# Patient Record
Sex: Male | Born: 1965
Health system: Southern US, Community
[De-identification: ages and names within clinical notes are randomized; demographics above are authoritative.]

## PROBLEM LIST (undated history)

## (undated) DIAGNOSIS — E785 Hyperlipidemia, unspecified: Secondary | ICD-10-CM

## (undated) DIAGNOSIS — Z8249 Family history of ischemic heart disease and other diseases of the circulatory system: Secondary | ICD-10-CM

## (undated) DIAGNOSIS — I10 Essential (primary) hypertension: Secondary | ICD-10-CM

## (undated) DIAGNOSIS — T7840XA Allergy, unspecified, initial encounter: Secondary | ICD-10-CM

## (undated) HISTORY — DX: Family history of ischemic heart disease and other diseases of the circulatory system: Z82.49

## (undated) HISTORY — DX: Hyperlipidemia, unspecified: E78.5

## (undated) HISTORY — DX: Essential (primary) hypertension: I10

## (undated) HISTORY — DX: Allergy, unspecified, initial encounter: T78.40XA

---

## 1991-10-15 ENCOUNTER — Encounter: Payer: Self-pay | Admitting: Cardiology

## 2002-12-07 ENCOUNTER — Ambulatory Visit (HOSPITAL_BASED_OUTPATIENT_CLINIC_OR_DEPARTMENT_OTHER): Admission: RE | Admit: 2002-12-07 | Discharge: 2002-12-07 | Payer: Self-pay | Admitting: Orthopedic Surgery

## 2006-04-28 ENCOUNTER — Ambulatory Visit: Payer: Self-pay | Admitting: Family Medicine

## 2006-05-29 ENCOUNTER — Ambulatory Visit: Payer: Self-pay | Admitting: Family Medicine

## 2007-02-02 ENCOUNTER — Ambulatory Visit: Payer: Self-pay | Admitting: Family Medicine

## 2007-02-18 ENCOUNTER — Ambulatory Visit: Payer: Self-pay | Admitting: Family Medicine

## 2007-02-27 ENCOUNTER — Ambulatory Visit: Payer: Self-pay | Admitting: Family Medicine

## 2007-04-03 ENCOUNTER — Ambulatory Visit: Payer: Self-pay | Admitting: Family Medicine

## 2007-04-14 ENCOUNTER — Ambulatory Visit: Payer: Self-pay | Admitting: Family Medicine

## 2007-10-19 ENCOUNTER — Emergency Department (HOSPITAL_COMMUNITY): Admission: EM | Admit: 2007-10-19 | Discharge: 2007-10-19 | Payer: Self-pay | Admitting: Emergency Medicine

## 2007-11-05 ENCOUNTER — Ambulatory Visit: Payer: Self-pay | Admitting: Family Medicine

## 2008-01-26 ENCOUNTER — Ambulatory Visit: Payer: Self-pay | Admitting: Family Medicine

## 2008-03-14 ENCOUNTER — Ambulatory Visit: Payer: Self-pay | Admitting: Family Medicine

## 2008-05-31 ENCOUNTER — Ambulatory Visit: Payer: Self-pay | Admitting: Family Medicine

## 2008-10-12 ENCOUNTER — Ambulatory Visit: Payer: Self-pay | Admitting: Family Medicine

## 2008-10-14 HISTORY — PX: LAPAROSCOPIC CHOLECYSTECTOMY: SUR755

## 2008-10-27 ENCOUNTER — Encounter: Admission: RE | Admit: 2008-10-27 | Discharge: 2008-10-27 | Payer: Self-pay | Admitting: Family Medicine

## 2008-10-27 ENCOUNTER — Ambulatory Visit: Payer: Self-pay | Admitting: Family Medicine

## 2008-11-22 ENCOUNTER — Ambulatory Visit: Payer: Self-pay | Admitting: Family Medicine

## 2008-11-28 ENCOUNTER — Encounter: Admission: RE | Admit: 2008-11-28 | Discharge: 2008-11-28 | Payer: Self-pay | Admitting: Family Medicine

## 2009-02-07 ENCOUNTER — Ambulatory Visit: Payer: Self-pay | Admitting: Family Medicine

## 2009-02-22 ENCOUNTER — Encounter: Admission: RE | Admit: 2009-02-22 | Discharge: 2009-02-22 | Payer: Self-pay | Admitting: Family Medicine

## 2009-02-22 ENCOUNTER — Ambulatory Visit: Payer: Self-pay | Admitting: Family Medicine

## 2009-03-09 ENCOUNTER — Ambulatory Visit: Payer: Self-pay | Admitting: Family Medicine

## 2009-04-18 ENCOUNTER — Ambulatory Visit: Payer: Self-pay | Admitting: Family Medicine

## 2009-04-21 ENCOUNTER — Encounter: Admission: RE | Admit: 2009-04-21 | Discharge: 2009-04-21 | Payer: Self-pay | Admitting: Family Medicine

## 2009-05-29 ENCOUNTER — Emergency Department (HOSPITAL_COMMUNITY): Admission: EM | Admit: 2009-05-29 | Discharge: 2009-05-29 | Payer: Self-pay | Admitting: Emergency Medicine

## 2009-05-30 ENCOUNTER — Ambulatory Visit: Payer: Self-pay | Admitting: Family Medicine

## 2009-06-14 ENCOUNTER — Ambulatory Visit (HOSPITAL_COMMUNITY): Admission: RE | Admit: 2009-06-14 | Discharge: 2009-06-14 | Payer: Self-pay | Admitting: Family Medicine

## 2009-07-21 ENCOUNTER — Encounter: Admission: RE | Admit: 2009-07-21 | Discharge: 2009-07-21 | Payer: Self-pay | Admitting: General Surgery

## 2009-10-30 ENCOUNTER — Encounter (INDEPENDENT_AMBULATORY_CARE_PROVIDER_SITE_OTHER): Payer: Self-pay | Admitting: *Deleted

## 2009-10-30 ENCOUNTER — Ambulatory Visit: Payer: Self-pay | Admitting: Family Medicine

## 2009-11-06 ENCOUNTER — Encounter: Admission: RE | Admit: 2009-11-06 | Discharge: 2009-11-06 | Payer: Self-pay | Admitting: Family Medicine

## 2009-11-07 ENCOUNTER — Encounter: Payer: Self-pay | Admitting: Cardiology

## 2009-11-07 ENCOUNTER — Ambulatory Visit: Payer: Self-pay | Admitting: Family Medicine

## 2009-11-14 ENCOUNTER — Ambulatory Visit: Payer: Self-pay | Admitting: Cardiology

## 2009-11-14 DIAGNOSIS — I10 Essential (primary) hypertension: Secondary | ICD-10-CM | POA: Insufficient documentation

## 2009-11-14 DIAGNOSIS — R072 Precordial pain: Secondary | ICD-10-CM | POA: Insufficient documentation

## 2009-11-14 DIAGNOSIS — E785 Hyperlipidemia, unspecified: Secondary | ICD-10-CM | POA: Insufficient documentation

## 2009-12-22 DIAGNOSIS — R079 Chest pain, unspecified: Secondary | ICD-10-CM | POA: Insufficient documentation

## 2009-12-25 ENCOUNTER — Ambulatory Visit: Payer: Self-pay | Admitting: Cardiology

## 2010-01-04 ENCOUNTER — Ambulatory Visit: Payer: Self-pay | Admitting: Family Medicine

## 2010-07-02 ENCOUNTER — Ambulatory Visit: Payer: Self-pay | Admitting: Family Medicine

## 2010-11-05 ENCOUNTER — Encounter: Payer: Self-pay | Admitting: Family Medicine

## 2010-11-13 NOTE — Consult Note (Signed)
Summary: [facility]  [facility]   Imported By: Marylou Mccoy 12/04/2009 12:56:20  _____________________________________________________________________  External Attachment:    Type:   Image     Comment:   External Document

## 2010-11-13 NOTE — Assessment & Plan Note (Signed)
Summary: np6/ chestpain. also eval for stress test. pt has atena. gd   Visit Type:  Initial Consult Primary Provider:  Dr. Standley Dakins  CC:  chest pain.  History of Present Illness: The patient presents for evaluation of chest discomfort. This has been going on for 2-3 weeks. It seems to come and go. When it does start it may be constant for 2-3 days. It is an upper chest pressure like "air pockets". He has taken Zantac to try to help this. However, it seems to go away on its own. It is moderate in intensity. There is no associated nausea vomiting or diaphoresis. There is no radiation elsewhere. He does not get short of breath with this. He is on like reflux. He did start taking Aleve and actually has had improvement in his symptoms with no episodes in the past several days. He is able to be a row but reactive exercising without bringing on this discomfort. He denies any palpitations, presyncope or syncope. He has had no PND or orthopnea. Of note he has had no prior cardiac history or workup.  Current Medications (verified): 1)  Amlodipine Besylate 5 Mg Tabs (Amlodipine Besylate) .Marland Kitchen.. 1 By Mouth Daily 2)  Zantac 150 Mg Tabs (Ranitidine Hcl) .Marland Kitchen.. 1 By Mouth Two Times A Day 3)  Aleve 220 Mg Tabs (Naproxen Sodium) .... 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   Vital Signs:  Patient profile:   45 year old male Height:      70 inches Weight:      207 pounds BMI:     29.81 Pulse rate:   97 / minute Resp:     16 per minute BP sitting:   128 / 92  (right arm)  Vitals Entered By: Marrion Coy, CNA  (November 14, 2009 9:07 AM)  Physical Exam  General:  Well developed, well nourished, in no acute distress. Head:  normocephalic and atraumatic Eyes:  PERRLA/EOM intact; conjunctiva and lids normal. Mouth:  Teeth, gums and palate normal. Oral mucosa normal. Neck:  Neck supple, no JVD. No masses, thyromegaly or abnormal cervical nodes. Chest Wall:  no deformities or breast masses noted Lungs:  Clear bilaterally to auscultation and percussion. Abdomen:  Bowel sounds positive; abdomen soft and non-tender without masses, organomegaly, or hernias noted. No hepatosplenomegaly. Msk:  Back normal, normal gait. Muscle strength and tone normal. Extremities:  No clubbing or cyanosis. Neurologic:  Alert and oriented x 3. Skin:  Intact without lesions or rashes. Cervical Nodes:  no significant adenopathy Axillary Nodes:  no significant adenopathy Inguinal Nodes:  no significant adenopathy Psych:  Normal affect.   Detailed Cardiovascular Exam  Neck    Carotids: Carotids full and equal bilaterally without bruits.      Neck Veins: Normal, no JVD.    Heart    Inspection: no deformities or lifts noted.      Palpation: normal PMI with no thrills palpable.      Auscultation: regular rate and rhythm, S1, S2 without murmurs, rubs, gallops, or clicks.    Vascular    Abdominal Aorta: no  palpable masses, pulsations, or audible bruits.      Femoral Pulses: normal femoral pulses bilaterally.      Pedal Pulses: normal pedal pulses bilaterally.      Radial Pulses: normal radial pulses bilaterally.      Peripheral Circulation: no clubbing, cyanosis, or edema noted with normal capillary refill.     EKG  Procedure date:  11/14/2009  Findings:      sinus rhythm, rate 97, axis within normal limits, intervals within normal limits, no acute ST-T wave changes.  Impression & Recommendations:  Problem # 1:  PRECORDIAL PAIN (ICD-786.51) The chest pain is atypical. However, he has a significant family  history of coronary disease. Given this screening with an exercise treadmill test is indicated. This will allow me to exclude obstructive coronary disease with a degree of certainty, risk stratify and most importantly give him a prescription for exercise. He is exercising 3 days per week and I would suggest 5 Orders: EKG w/ Interpretation (93000) Treadmill (Treadmill)  Problem # 2:  DYSLIPIDEMIA (ICD-272.4) I will defer to his primary M.D. He said he had high lipids in the past but didn't tolerate Lipitor.  Problem # 3:  ESSENTIAL HYPERTENSION, BENIGN (ICD-401.1) His blood pressure is controlled. He will continue the meds as listed.  Patient Instructions: 1)  Your physician recommends that you schedule a follow-up appointment at time of treadmill 2)  Your physician recommends that you continue on your current medications as directed. Please refer to the Current Medication list given to you today. 3)  Your physician has requested that you have an exercise tolerance test.  For further information please visit [email or web address]  Please also follow instruction sheet, as given.

## 2011-01-07 ENCOUNTER — Encounter (INDEPENDENT_AMBULATORY_CARE_PROVIDER_SITE_OTHER): Payer: Managed Care, Other (non HMO) | Admitting: Family Medicine

## 2011-01-07 DIAGNOSIS — Z8249 Family history of ischemic heart disease and other diseases of the circulatory system: Secondary | ICD-10-CM

## 2011-01-07 DIAGNOSIS — Z0000 Encounter for general adult medical examination without abnormal findings: Secondary | ICD-10-CM

## 2011-01-07 DIAGNOSIS — E785 Hyperlipidemia, unspecified: Secondary | ICD-10-CM

## 2011-01-07 DIAGNOSIS — I10 Essential (primary) hypertension: Secondary | ICD-10-CM

## 2011-01-19 LAB — DIFFERENTIAL
Basophils Absolute: 0 10*3/uL (ref 0.0–0.1)
Eosinophils Absolute: 0 10*3/uL (ref 0.0–0.7)
Eosinophils Relative: 1 % (ref 0–5)
Lymphocytes Relative: 52 % — ABNORMAL HIGH (ref 12–46)
Monocytes Absolute: 0.3 10*3/uL (ref 0.1–1.0)

## 2011-01-19 LAB — CBC
HCT: 41.7 % (ref 39.0–52.0)
Hemoglobin: 14.6 g/dL (ref 13.0–17.0)
MCV: 91.1 fL (ref 78.0–100.0)
RDW: 13.3 % (ref 11.5–15.5)

## 2011-01-19 LAB — POCT CARDIAC MARKERS
CKMB, poc: <1 ng/mL — ABNORMAL LOW (ref 1.0–8.0)
Troponin i, poc: <0.05 ng/mL (ref 0.00–0.09)
Troponin i, poc: <0.05 ng/mL (ref 0.00–0.09)

## 2011-01-19 LAB — POCT I-STAT, CHEM 8
BUN: 11 mg/dL (ref 6–23)
Calcium, Ion: 1.13 mmol/L (ref 1.12–1.32)
Chloride: 101 mEq/L (ref 96–112)
Glucose, Bld: 91 mg/dL (ref 70–99)
TCO2: 27 mmol/L (ref 0–100)

## 2011-01-31 ENCOUNTER — Ambulatory Visit (INDEPENDENT_AMBULATORY_CARE_PROVIDER_SITE_OTHER): Payer: Managed Care, Other (non HMO) | Admitting: Family Medicine

## 2011-01-31 DIAGNOSIS — J069 Acute upper respiratory infection, unspecified: Secondary | ICD-10-CM

## 2011-03-01 NOTE — Op Note (Signed)
NAME:  Samuel Richards, Samuel Richards                       ACCOUNT NO.:  [id number]   MEDICAL RECORD NO.:  [id number]                   PATIENT TYPE:  AMB   LOCATION:  [facility]                                  FACILITY:  [facility]   PHYSICIAN:  Katy Fitch. Naaman Plummer., M.D.          DATE OF BIRTH:  10-23-1965   DATE OF PROCEDURE:  12/07/2002  DATE OF DISCHARGE:                                 OPERATIVE REPORT   PREOPERATIVE DIAGNOSIS:  Foreign body, right palm overlying index flexor  sheath, A1 pulley, status post penetrating injury 10 days prior, without  signs of spontaneous pointing out.   POSTOPERATIVE DIAGNOSIS:  Foreign body, right palm overlying index flexor  sheath, A1 pulley, status post penetrating injury 10 days prior, without  signs of spontaneous pointing out.   PROCEDURE:  Incision and removal of wood foreign body, right palm and flexor  sheath region.   SURGEON:  Katy Fitch. Sypher, M.D.   ASSISTANT:  Jonni Sanger, P.A.   ANESTHESIA:  Marcaine 0.25% and 2% lidocaine field block without sedation.  This was performed in the minor operating room.   INDICATIONS:  The patient is a 45 year old gentleman who sustained an on-the-  job penetrating injury to his right palm.  He was seen at an outpatient  facility, where he was noted to have a probable splinter.  The physicians at  the outpatient facility tried to remove the splinter without success.  He  was placed on antibiotics and observed for more than one week.  His splinter  did not point out spontaneously; therefore, he was referred for a hand  surgery consult.   Clinical examination at this time revealed evidence of a firm wood body  directly over the flexor sheath of his right index finger at the level of  the metacarpal head.  He was unable to fully flex his finger, raising the  question of whether or not the foreign body had penetrated the flexor  sheath.   After informed consent he is brought to the operating room at this  time.   DESCRIPTION OF PROCEDURE:  The patient was brought to the operating room and  placed in the supine position on the operating table.  Following Betadine  prep, 0.25% Marcaine and 2% lidocaine were infiltrated at the metacarpal  head level to obtain a field block and digital block of the right index  finger.   When anesthesia was satisfactory, the arm was prepped with Betadine soap and  solution and sterilely draped.  The arm was exsanguinated with direct  compression and an arterial tourniquet on the proximal brachium inflated to  250 mmHg.  The procedure commenced with a short incision directly over the  mass.  The subcutaneous tissues were carefully divided, taking care to  identify the radial proper digital nerve to the index finger.  This was  gently retracted.  The palpable mass was easily identified.  This was  circumferentially dissected  with tenotomy scissors and the base of the  splinter identified.  This was in a vertical position extending to the  flexor sheath.  This was easily removed.  The wound was then irrigated and  inspected.  The A1 pulley was inspected and found to be intact.  There was  no sign of distention of the sheath due to purulent material.  After the  fragment was removed, full range of motion of the finger was recovered.  There were no signs of deep wound sepsis, but there was some inflammation  due to the presence of foreign body.   The wound was irrigated and dressed open with Xeroflo, sterile gauze, and an  Ace wrap.   The patient is placed on Darvocet-N 100 one or two tablets p.o. q.4-6h.  p.r.n. pain.  He will return to our office for follow-up in approximately  five days for dressing change.                                               Katy Fitch Naaman Plummer., M.D.    RVS/MEDQ  D:  12/07/2002  T:  12/07/2002  Job:  [id number]

## 2011-06-02 IMAGING — NM NM HEPATO W/GB/PHARM/[PERSON_NAME]
3 series · 13 of 13 positions shown · non-contrast
Comparison: None

CLINICAL DATA: Abdominal discomfort and bloating

NUCLEAR MEDICINE HEPATOBILIARY IMAGING WITH GALLBLADDER EF
TECHNIQUE: Sequential images of the abdomen were obtained [DATE]
minutes following intravenous administration of
radiopharmaceutical.  After slow intravenous infusion of 1.02 uCg
Cholecystokinin, gallbladder ejection fraction was determined.
Radiopharmaceutical:  5 mCi Vc-YYm Choletec

[he hepatobiliary · 3.43mm/px · 6 of 30 frames shown (1 of 3)]
[frame 3/30]
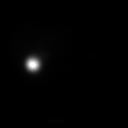
[frame 8/30]
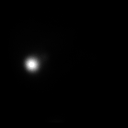
[frame 13/30]
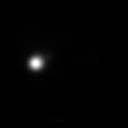
[frame 18/30]
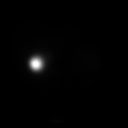
[frame 23/30]
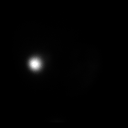
[frame 28/30]
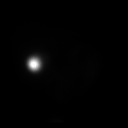

[he hepatobiliary · 1 of 1 slices shown (2 of 3)]
[im 1/1]
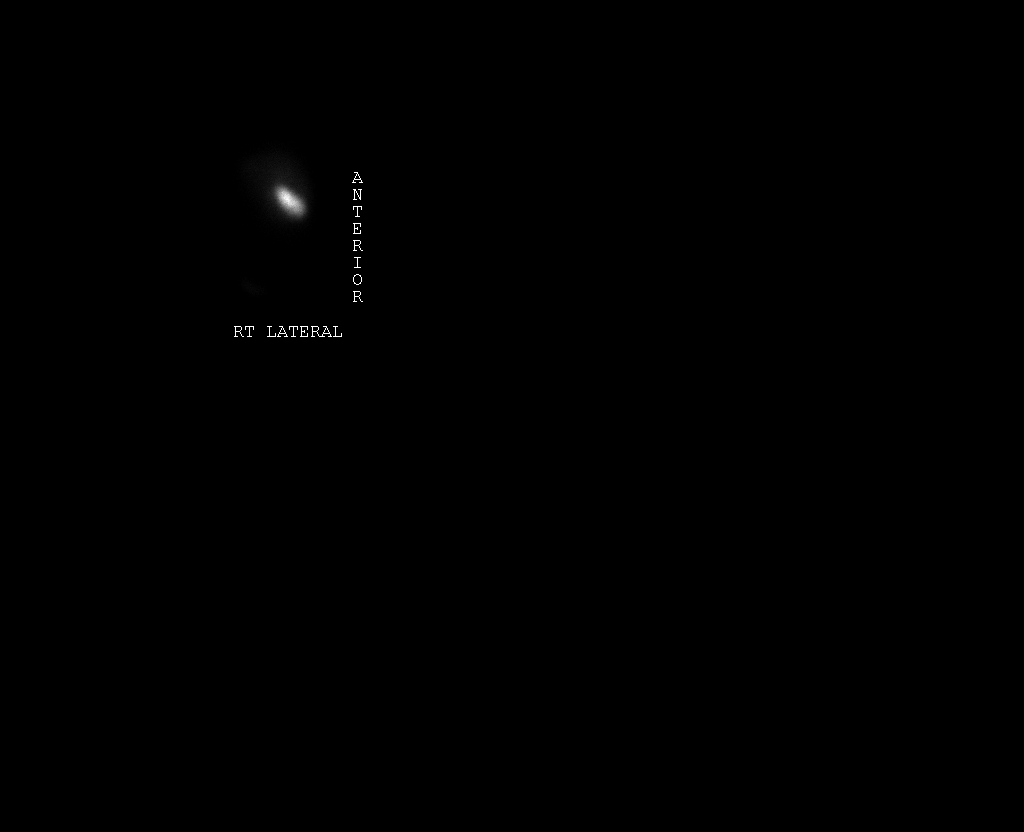

[he hepatobiliary · 3.43mm/px · 6 of 49 frames shown (3 of 3)]
[frame 5/49]
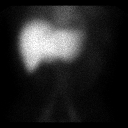
[frame 13/49]
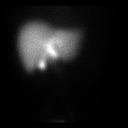
[frame 21/49]
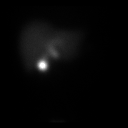
[frame 29/49]
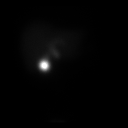
[frame 37/49]
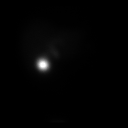
[frame 45/49]
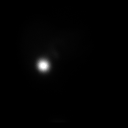

[13 of 13 positions shown; findings below may reference images not displayed]

FINDINGS: The the patient was injected with 5 mCi of technetium 99m
Choletec intravenously and imaging of the upper abdomen was
performed.  The radionuclide appears throughout the liver.  There
is excretion into the intrahepatic ductal system with visualization
of the gallbladder and common bile duct.  This represents a normal
nuclear medicine hepatobiliary scan.

The patient did not  experience symptoms during CCK infusion.
mcg of CCK were infused and gallbladder ejection fraction was
assessed.  At 30 minutes the gallbladder ejection fraction is
measured at only 5.2%.  Normal values are greater than or equal to
30% injected at 30 minutes.
IMPRESSION: 1.  Normal nuclear medicine hepatobiliary scan.
2.  Abnormal gallbladder ejection fraction of only 5.2% at 30
minutes.

## 2011-07-17 ENCOUNTER — Other Ambulatory Visit: Payer: Self-pay | Admitting: Family Medicine

## 2011-09-18 ENCOUNTER — Other Ambulatory Visit: Payer: Self-pay | Admitting: Family Medicine

## 2011-09-23 ENCOUNTER — Encounter: Payer: Self-pay | Admitting: Internal Medicine

## 2011-09-23 ENCOUNTER — Ambulatory Visit (INDEPENDENT_AMBULATORY_CARE_PROVIDER_SITE_OTHER): Payer: Managed Care, Other (non HMO) | Admitting: Medical

## 2011-09-23 ENCOUNTER — Encounter: Payer: Self-pay | Admitting: Medical

## 2011-09-23 DIAGNOSIS — R110 Nausea: Secondary | ICD-10-CM

## 2011-09-23 DIAGNOSIS — R1031 Right lower quadrant pain: Secondary | ICD-10-CM

## 2011-09-23 DIAGNOSIS — R10819 Abdominal tenderness, unspecified site: Secondary | ICD-10-CM

## 2011-09-23 DIAGNOSIS — R109 Unspecified abdominal pain: Secondary | ICD-10-CM | POA: Insufficient documentation

## 2011-09-23 DIAGNOSIS — Z125 Encounter for screening for malignant neoplasm of prostate: Secondary | ICD-10-CM

## 2011-09-23 LAB — POCT URINALYSIS DIPSTICK
Bilirubin, UA: NEGATIVE
Blood, UA: NEGATIVE
Glucose, UA: NEGATIVE
Ketones, UA: NEGATIVE
Spec Grav, UA: 1.01

## 2011-09-23 NOTE — Progress Notes (Signed)
Subjective:   HPI  Samuel Richards is a 44 y.o. male who presents with several month hx/o intermittent RLQ abdominal pain.  It has been giving 4/10 pain the last few days.   He notes lower abdominal pain, worse RLQ, radiates to right low back, pain is dull and aching.  Worse last 3-4 days.  He denies trauma, injury, no hx/o renal stone, on recent urinary changes, no hx/o prostate problems, no family hx/o cancer.  Denies recent fever, weight loss, constipation, no blood in stool or urine, no hx/o hip or other arthritis, no vomiting or diarrhea, but has had some nausea.  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 No hx/o hernia.  Using Aleve with some relief.  No other aggravating or relieving factors.    No other c/o.  The following portions of the patient's history were reviewed and updated as appropriate: allergies, current medications, past family history, past medical history, past social history, past surgical history and problem list.  Past Medical History  Diagnosis Date  . Allergy   . Hypertension   . Dyslipidemia   . FH: CAD (coronary artery disease)    Review of Systems Constitutional: -fever, -chills, -sweats, -unexpected -weight change,-fatigue ENT: -runny nose, -ear pain, -sore throat Cardiology:  -chest pain, -palpitations, -edema Respiratory: -cough, -shortness of breath, -wheezing Gastroenterology: +abdominal pain, -nausea, -vomiting, -diarrhea, -constipation Hematology: -bleeding or bruising problems Musculoskeletal: -arthralgias, -myalgias, -joint swelling, +back pain Ophthalmology: -vision changes Urology: -dysuria, -difficulty urinating, -hematuria, -urinary frequency, -urgency Neurology: -headache, -weakness, -tingling, -numbness     Objective:   Physical Exam  Filed Vitals:   09/23/11 1401  BP: 128/88  Pulse: 72  Temp: 98.2 F (36.8 C)  Resp: 12    General appearance: alert, no distress, WD/WN, black male Oral cavity: MMM, no  lesions Neck: supple, no lymphadenopathy, no thyromegaly, no masses Heart: RRR, normal S1, S2, no murmurs Lungs: CTA bilaterally, no wheezes, rhonchi, or rales Abdomen: +bs, soft, mild to moderate right lower abdomen, non distended, no masses, no hepatomegaly, no splenomegaly Pulses: 2+ symmetric, upper and lower extremities, normal cap refill Rectal: anus normal, nontender, can't palpate entire prostate, but no obvious nodules, nontender, firm   Assessment and Plan :    Encounter Diagnoses  Name Primary?  . Abdominal pain Yes  . RLQ abdominal pain   . Inguinal tenderness   . Nausea   . Screening for prostate cancer    Advised that etiology is broad, could be renal stone, possibly appendicitis or infection, but can't rule out other.  Urinalysis today unremarkable. Will check labs, will set up for CT abdomen/pelvis.  Call if worse in meantime, otherwise follow-up pending studies.

## 2011-09-24 ENCOUNTER — Telehealth: Payer: Self-pay | Admitting: Family Medicine

## 2011-09-24 LAB — CBC WITH DIFFERENTIAL/PLATELET
Basophils Relative: 0 % (ref 0–1)
Hemoglobin: 13.7 g/dL (ref 13.0–17.0)
MCHC: 33.8 g/dL (ref 30.0–36.0)
Monocytes Relative: 6 % (ref 3–12)
Neutro Abs: 1.5 10*3/uL — ABNORMAL LOW (ref 1.7–7.7)
Neutrophils Relative %: 36 % — ABNORMAL LOW (ref 43–77)
RBC: 4.45 MIL/uL (ref 4.22–5.81)

## 2011-09-24 LAB — COMPREHENSIVE METABOLIC PANEL
AST: 16 U/L (ref 0–37)
Albumin: 4.5 g/dL (ref 3.5–5.2)
BUN: 19 mg/dL (ref 6–23)
CO2: 28 mEq/L (ref 19–32)
Calcium: 9.4 mg/dL (ref 8.4–10.5)
Chloride: 102 mEq/L (ref 96–112)
Glucose, Bld: 97 mg/dL (ref 70–99)
Potassium: 3.9 mEq/L (ref 3.5–5.3)

## 2011-09-24 LAB — PSA: PSA: 0.89 ng/mL (ref <=4.00)

## 2011-09-24 NOTE — Telephone Encounter (Signed)
PATIENTS CT ABD/PELVIS IS 09/25/11 @ 1030AM. CLS

## 2011-09-24 NOTE — Telephone Encounter (Signed)
Message copied by Janeice Robinson on Tue Sep 24, 2011  2:41 PM ------      Message from: Jac Canavan      Created: Mon Sep 23, 2011  7:28 PM       CT ab/pelvis

## 2011-09-25 ENCOUNTER — Ambulatory Visit
Admission: RE | Admit: 2011-09-25 | Discharge: 2011-09-25 | Disposition: A | Discharge status: Home / Self Care | Payer: Managed Care, Other (non HMO) | Source: Ambulatory Visit | Attending: Medical | Admitting: Medical

## 2011-09-25 LAB — URINE CULTURE: Colony Count: NO GROWTH

## 2011-09-25 MED ORDER — IOHEXOL 300 MG/ML  SOLN
125.0000 mL | Freq: Once | INTRAMUSCULAR | Status: AC | PRN
  Administered 2011-09-25: 125 mL via INTRAVENOUS

## 2011-09-25 NOTE — Telephone Encounter (Signed)
DONE

## 2011-10-16 ENCOUNTER — Other Ambulatory Visit: Payer: Self-pay | Admitting: Family Medicine

## 2011-12-09 ENCOUNTER — Other Ambulatory Visit: Payer: Self-pay | Admitting: Internal Medicine

## 2011-12-10 ENCOUNTER — Other Ambulatory Visit: Payer: Self-pay | Admitting: Medical

## 2011-12-10 MED ORDER — AMLODIPINE BESYLATE 5 MG PO TABS: 5.0000 mg | ORAL_TABLET | Freq: Every day | ORAL | Status: DC

## 2011-12-11 NOTE — Telephone Encounter (Signed)
done

## 2012-01-30 ENCOUNTER — Other Ambulatory Visit: Payer: Self-pay | Admitting: Family Medicine

## 2012-02-25 ENCOUNTER — Other Ambulatory Visit: Payer: Self-pay | Admitting: Family Medicine

## 2012-04-05 ENCOUNTER — Other Ambulatory Visit: Payer: Self-pay | Admitting: Family Medicine

## 2012-05-04 ENCOUNTER — Telehealth: Payer: Self-pay | Admitting: Family Medicine

## 2012-05-04 ENCOUNTER — Other Ambulatory Visit: Payer: Self-pay | Admitting: Family Medicine

## 2012-05-04 MED ORDER — ROSUVASTATIN CALCIUM 10 MG PO TABS: 10.0000 mg | ORAL_TABLET | Freq: Every day | ORAL | Status: DC

## 2012-05-04 NOTE — Telephone Encounter (Signed)
Crestor renewed. Patient has a CPE scheduled

## 2012-05-12 ENCOUNTER — Ambulatory Visit (INDEPENDENT_AMBULATORY_CARE_PROVIDER_SITE_OTHER): Payer: Managed Care, Other (non HMO) | Admitting: Family Medicine

## 2012-05-12 ENCOUNTER — Encounter: Payer: Self-pay | Admitting: Family Medicine

## 2012-05-12 VITALS — BP 124/84 | HR 92 | Ht 70.0 in | Wt 214.0 lb

## 2012-05-12 DIAGNOSIS — I10 Essential (primary) hypertension: Secondary | ICD-10-CM

## 2012-05-12 DIAGNOSIS — Z0000 Encounter for general adult medical examination without abnormal findings: Secondary | ICD-10-CM

## 2012-05-12 DIAGNOSIS — E785 Hyperlipidemia, unspecified: Secondary | ICD-10-CM

## 2012-05-12 LAB — LIPID PANEL
LDL Cholesterol: 59 mg/dL (ref 0–99)
Total CHOL/HDL Ratio: 3.2 Ratio
VLDL: 32 mg/dL (ref 0–40)

## 2012-05-12 NOTE — Progress Notes (Signed)
  Subjective:    Patient ID: Samuel Richards, male    DOB: 04-28-1966, 46 y.o.   MRN: [id number]  HPI He is here for complete examination. His record was reviewed. He had a colonoscopy done earlier this year. His tetanus is up to date. Last blood work was in December. He has had difficulty recently with some postnasal drainage but no sneezing, itchy watery eyes, congestion. He does not smoke. He exercises regularly. His work and home life are going quite well. Family and social history were reviewed.   Review of Systems  Constitutional: Negative.   HENT: Negative.   Eyes: Negative.   Respiratory: Negative.   Cardiovascular: Negative.   Gastrointestinal: Negative.   Genitourinary: Negative.   Musculoskeletal: Negative.   Skin: Negative.   Neurological: Negative.   Hematological: Negative.   Psychiatric/Behavioral: Negative.        Objective:   Physical Exam BP 124/84  Pulse 92  Ht 5\' 10"  (1.778 m)  Wt 214 lb (97.07 kg)  BMI 30.71 kg/m2  SpO2 99%  General Appearance:    Alert, cooperative, no distress, appears stated age  Head:    Normocephalic, without obvious abnormality, atraumatic  Eyes:    PERRL, conjunctiva/corneas clear, EOM's intact, fundi    benign  Ears:    Normal TM's and external ear canals  Nose:   Nares normal, mucosa normal, no drainage or sinus   tenderness  Throat:   Lips, mucosa, and tongue normal; teeth and gums normal  Neck:   Supple, no lymphadenopathy;  thyroid:  no   enlargement/tenderness/nodules; no carotid   bruit or JVD  Back:    Spine nontender, no curvature, ROM normal, no CVA     tenderness  Lungs:     Clear to auscultation bilaterally without wheezes, rales or     ronchi; respirations unlabored  Chest Wall:    No tenderness or deformity   Heart:    Regular rate and rhythm, S1 and S2 normal, no murmur, rub   or gallop  Breast Exam:    No chest wall tenderness, masses or gynecomastia  Abdomen:     Soft, non-tender, nondistended, normoactive  bowel sounds,    no masses, no hepatosplenomegaly  Genitalia:   deferred   Rectal:   deferred   Extremities:   No clubbing, cyanosis or edema  Pulses:   2+ and symmetric all extremities  Skin:   Skin color, texture, turgor normal, no rashes or lesions  Lymph nodes:   Cervical, supraclavicular, and axillary nodes normal  Neurologic:   CNII-XII intact, normal strength, sensation and gait; reflexes 2+ and symmetric throughout          Psych:   Normal mood, affect, hygiene and grooming.           Assessment & Plan:   1. Routine general medical examination at a health care facility    2. DYSLIPIDEMIA  Lipid panel  3. Essential hypertension, benign     continue present medications. Encouraged him to continue with his regular exercise. No particular therapy for the PND at this time the

## 2012-05-12 NOTE — Patient Instructions (Signed)
Keep taking good care of yourself 

## 2012-06-12 ENCOUNTER — Other Ambulatory Visit: Payer: Self-pay | Admitting: Family Medicine

## 2012-10-20 ENCOUNTER — Ambulatory Visit (INDEPENDENT_AMBULATORY_CARE_PROVIDER_SITE_OTHER): Payer: Managed Care, Other (non HMO) | Admitting: Medical

## 2012-10-20 ENCOUNTER — Encounter: Payer: Self-pay | Admitting: Medical

## 2012-10-20 VITALS — BP 132/80 | HR 72 | Temp 98.4°F | Resp 16 | Wt 211.0 lb

## 2012-10-20 DIAGNOSIS — R079 Chest pain, unspecified: Secondary | ICD-10-CM

## 2012-10-20 DIAGNOSIS — K2990 Gastroduodenitis, unspecified, without bleeding: Secondary | ICD-10-CM

## 2012-10-20 DIAGNOSIS — K2970 Gastritis, unspecified, without bleeding: Secondary | ICD-10-CM

## 2012-10-20 DIAGNOSIS — I10 Essential (primary) hypertension: Secondary | ICD-10-CM

## 2012-10-20 MED ORDER — OMEPRAZOLE 40 MG PO CPDR: DELAYED_RELEASE_CAPSULE | ORAL | Status: DC

## 2012-10-20 NOTE — Progress Notes (Signed)
Subjective: Here for c/o pain shooting from chest to back.  Started this week, has occurred about 4 times.  Each time brief, lasts for a few seconds, feels a sharp pain going from front of upper chest to back.  Can get similar pain if turns torso a certain way.  Chest feels kind of sore.  Denies any recent heavy lifting or strenuous activity. He does note some recent belching and acid indigestion.  Used some zantac a few times that didn't help.  When the sharp pains occurred, did get a flush feeling, dry mouth, something just didn't feel right.  Not sure if this is his heart or something else.  He does note BP being elevated a few times 150/90s.  No other aggravating or relieving factors.  No other c/o.   Past Medical History  Diagnosis Date  . Hypertension   . Dyslipidemia   . FH: CAD (coronary artery disease)   . Allergy   'ROS as in HPI    Objective:   Physical Exam  Filed Vitals:   10/20/12 1458  BP: 132/80  Pulse: 72  Temp: 98.4 F (36.9 C)  Resp: 16    General appearance: alert, no distress, WD/WN Oral cavity: MMM, no lesions Neck: supple, no lymphadenopathy, no thyromegaly, no masses Chest wall nontender Back: mild tenderness of paraspinal muscles upper back Heart: RRR, normal S1, S2, no murmurs Lungs: CTA bilaterally, no wheezes, rhonchi, or rales Abdomen: +bs, soft, mild epigastric tenderness, otherwise nontender, non distended, no masses, no hepatomegaly, no splenomegaly Pulses: 2+ symmetric, upper and lower extremities, normal cap refill   Adult ECG Report  Indication: chest pain  Rate: 76bpm  Rhythm: normal sinus rhythm and sinus arrhythmia  QRS Axis: 67 degrees  PR Interval:  QRS Duration:  QTc:  Conduction Disturbances: none  Other Abnormalities: questionable PQ depression II and questionable ST elevation V1, but this was seen on prior EKGs from 2011, no acute change today  Patient's cardiac risk factors are: hypertension and male gender.  EKG comparison: 1/11 EKG, no acute changes  Narrative Interpretation: sinus arrhythmia, no other acute changes   Assessment and Plan :    Encounter Diagnoses  Name Primary?  . Chest pain Yes  . Gastritis   . Essential hypertension, benign    Reviewed EKG.  Advised that symptoms suggest more of a gastritis etiology, doesn't sound cardiac.  He also has some musculoskeletal tenderness.  Advised short term 2-3 wk trial of Omeprazole, avoid GERD triggers, hydrate well.  Bring BP log next time.  C/t norvasc 5mg  daily.   We did discuss signs of MI that would prompt him to call 911.  Call/return prn, otherwise recheck in 7mo.

## 2012-10-20 NOTE — Patient Instructions (Addendum)
   Begin Omeprazole once daily in the morning 45 minutes before breakfast.  Avoid foods that worsen reflux and indigestion such as acid foods, greasy foods, fried foods, gassy foods like beans and onions, and spicy foods.    Drink plenty of water.   Recheck 2 weeks, recheck sooner if worse   SYMPTOMS OF A HEART ATTACK The most common signs and symptoms include:   Tightness or squeezing in the chest.   Feeling of heaviness on the chest.   Discomfort in the arms, neck, or jaw.   Shortness of breath and nausea.   Cold, wet skin.   Chest pain or discomfort brought on by physical effort or excitement which increase the oxygen needs of the heart.   SEEK IMMEDIATE MEDICAL CARE IF:   You develop nausea, vomiting, or shortness of breath.   You feel faint, lightheaded, or pass out.   Your chest discomfort gets worse.   You are sweating or experience sudden profound fatigue.   Your discomfort lasts longer than 15 minutes.   If you have a history of heart disease or angina, you should tell your caregiver right away about any increase in the severity or frequency of your chest discomfort or angina attacks. When you have angina, you should stop what you are doing and sit down. This may bring relief in 3 to 5 minutes. If your caregiver has prescribed nitro, take it as directed.   WHAT IS A HEART ATTACK: Myocardial Infarction A myocardial infarction (MI) is damage to the heart that is not reversible. It is also called a heart attack. An MI usually occurs when a heart (coronary) artery becomes blocked or narrowed. This cuts off the blood supply to the heart. When one or more of the heart (coronary) arteries becomes blocked, that area of the heart begins to die. This causes pain felt during an MI.  If you think you might be having an MI, call your local emergency services immediately (911 in [place] It is recommended that you take a 162 mg non-enteric coated aspirin if you do not have an aspirin  allergy. Do not drive yourself to the hospital or wait to see if your symptoms go away. The sooner MI is treated, the greater the amount of heart muscle saved. Time is muscle. It can save your life. CAUSES  An MI can occur from:  A gradual buildup of a fatty substance called plaque. When plaque builds up in the arteries, this condition is called atherosclerosis. This buildup can block or reduce the blood supply to the heart artery(s).   A sudden plaque rupture within a heart artery that causes a blood clot (thrombus). A blood clot can block the heart artery which does not allow blood flow to the heart.   A severe tightening (spasm) of the heart artery. This is a less common cause of a heart attack. When a heart artery spasms, it cuts off blood flow through the artery. Spasms can occur in heart arteries that do not have atherosclerosis.  RISK FACTORS People at risk for an MI usually have one or more risk factors, such as:  High blood pressure.   High cholesterol.   Smoking.   Gender. Men have a higher heart attack risk.   Overweight/obesity.   Age.   Family history.   Lack of exercise.   Diabetes.   Stress.   Excessive alcohol use.   Street drug use (cocaine and methamphetamines).

## 2012-10-22 ENCOUNTER — Telehealth: Payer: Self-pay | Admitting: Medical

## 2012-10-26 NOTE — Addendum Note (Signed)
Addended by: Jac Canavan on: 10/26/2012 07:46 PM   Modules accepted: Orders

## 2012-10-28 NOTE — Telephone Encounter (Signed)
LM

## 2012-11-02 ENCOUNTER — Other Ambulatory Visit: Payer: Self-pay

## 2012-11-02 MED ORDER — ROSUVASTATIN CALCIUM 10 MG PO TABS: 10.0000 mg | ORAL_TABLET | Freq: Every day | ORAL | Status: DC

## 2012-11-02 NOTE — Telephone Encounter (Signed)
Sent in before but ins. Requires 90 days on crestor sent revised script in

## 2012-11-13 ENCOUNTER — Encounter: Payer: Self-pay | Admitting: Medical

## 2012-11-13 ENCOUNTER — Ambulatory Visit (INDEPENDENT_AMBULATORY_CARE_PROVIDER_SITE_OTHER): Payer: Managed Care, Other (non HMO) | Admitting: Medical

## 2012-11-13 VITALS — BP 122/86 | HR 76 | Resp 18 | Wt 208.0 lb

## 2012-11-13 DIAGNOSIS — R079 Chest pain, unspecified: Secondary | ICD-10-CM

## 2012-11-13 DIAGNOSIS — I10 Essential (primary) hypertension: Secondary | ICD-10-CM

## 2012-11-13 DIAGNOSIS — M549 Dorsalgia, unspecified: Secondary | ICD-10-CM

## 2012-11-13 DIAGNOSIS — E785 Hyperlipidemia, unspecified: Secondary | ICD-10-CM

## 2012-11-13 DIAGNOSIS — Z125 Encounter for screening for malignant neoplasm of prostate: Secondary | ICD-10-CM

## 2012-11-13 NOTE — Progress Notes (Signed)
Subjective: Here for multiple issues.  I saw him recently for chest and back pain.   After using Omeprazole and cutting out spicy foods, the pains completely resolved.  He has had no additional similar pains.    He does report getting a catch in his upper back from time to time if he turns a certain way.   He notes going to chiropractor 2 years for similar back pain.  Was told he may have bulging discs.  He wonders if he should go back to chiropractor.  Pain is not severe, not daily.   No arm pains, neck pains, no arm numbness, tingling, weakness.  He has a biometric form that he needs completed for work.   He is compliant with his BP medication and cholesterol medication daily.    Past Medical History  Diagnosis Date  . Hypertension   . Dyslipidemia   . FH: CAD (coronary artery disease)   . Allergy    Review of Systems Constitutional: -fever, -chills, -sweats, -unexpected -weight change,-fatigue ENT: -runny nose, -ear pain, -sore throat Cardiology:  -chest pain, -palpitations, -edema Respiratory: -cough, -shortness of breath, -wheezing Gastroenterology: -abdominal pain, -nausea, -vomiting, -diarrhea, -constipation  Musculoskeletal: -arthralgias, -myalgias, -joint swelling, +back pain Ophthalmology: -vision changes Urology: -dysuria, -difficulty urinating, -hematuria, -urinary frequency, -urgency Neurology: -headache, -weakness, -tingling, -numbness   Objective: Filed Vitals:   11/13/12 0940  BP: 122/86  Pulse: 76  Resp: 18    General appearance: alert, no distress, WD/WN Neck: supple, no lymphadenopathy, no thyromegaly, no masses Heart: RRR, normal S1, S2, no murmurs Lungs: CTA bilaterally, no wheezes, rhonchi, or rales Abdomen: +bs, soft, non tender, non distended, no masses, no hepatomegaly, no splenomegaly Back: nontender, normal ROM, no obvious deformity MSK: UE nontender, normal ROM, no deformity Pulses: 2+ symmetric, upper and lower extremities, normal cap  refill  Assessment: Encounter Diagnoses  Name Primary?  . Back pain Yes  . Hyperlipidemia   . Essential hypertension, benign   . Chest pain      Plan: Back pain - discussed daily stretching and exercise routine.  Can use OTC NSAID prn.  He will work on pre -work stretching routine.  If not seeing improvement in a few weeks, he can call back and I'll send referral for chiropractic or PT for upper back pain.  Hyperlipidemia - compliant with current medication.   HTN - compliant with current medication, controlled.  Chest pain - resolved.   Follow-up for labs soon, fasting

## 2012-11-16 ENCOUNTER — Telehealth: Payer: Self-pay | Admitting: Family Medicine

## 2012-11-16 NOTE — Telephone Encounter (Signed)
Patient was notified to come by the office when he gets a chance to have lab work done. CLS

## 2012-11-16 NOTE — Telephone Encounter (Signed)
Message copied by Janeice Robinson on Mon Nov 16, 2012  2:53 PM ------      Message from: Jac Canavan      Created: Fri Nov 13, 2012 10:23 PM       i reviewed back over his chart.   He is actually due for labs.  Last labs July.  At his convenience, have him come in for fasting labs.  I have put orders in the computer.  (nurse visit)

## 2012-11-30 ENCOUNTER — Ambulatory Visit (INDEPENDENT_AMBULATORY_CARE_PROVIDER_SITE_OTHER): Payer: Managed Care, Other (non HMO) | Admitting: Medical

## 2012-11-30 ENCOUNTER — Encounter: Payer: Self-pay | Admitting: Medical

## 2012-11-30 VITALS — BP 120/82 | HR 72 | Temp 98.5°F | Resp 16 | Wt 210.0 lb

## 2012-11-30 DIAGNOSIS — J019 Acute sinusitis, unspecified: Secondary | ICD-10-CM

## 2012-11-30 MED ORDER — AMOXICILLIN 875 MG PO TABS: 875.0000 mg | ORAL_TABLET | Freq: Two times a day (BID) | ORAL | Status: DC

## 2012-11-30 NOTE — Progress Notes (Signed)
Subjective:  Samuel Richards is a 47 y.o. male who presents for 1 wk hx/o sinus pressure, nasal green mucous drainage, slight fever, ear fullness, and mild fatigue.  He notes similar symptoms with prior sinus infection.  Using [facility] cold and sinus for symptoms.  Denies sick contacts.  No other aggravating or relieving factors.  No other c/o.  Objective: Filed Vitals:   11/30/12 1548  BP: 120/82  Pulse: 72  Temp: 98.5 F (36.9 C)  Resp: 16    General appearance: Alert, WD/WN, no distress                             Skin: warm, no rash                           Head: +maxillary sinus tenderness,                            Eyes: conjunctiva normal, corneas clear, PERRLA                            Ears: pearly TMs, external ear canals normal                          Nose: septum midline, turbinates swollen, with erythema and clear discharge             Mouth/throat: MMM, tongue normal, mild pharyngeal erythema                           Neck: supple, no adenopathy, no thyromegaly, nontender                          Heart: RRR, normal S1, S2, no murmurs                         Lungs: CTA bilaterally, no wheezes, rales, or rhonchi      Assessment and Plan:   Encounter Diagnosis  Name Primary?  . Acute sinusitis Yes    Prescription given for Amoxicillin.  Discussed symptomatic relief, nasal saline, and call or return if worse or not improving

## 2012-12-14 ENCOUNTER — Other Ambulatory Visit: Payer: Self-pay

## 2012-12-14 MED ORDER — AMLODIPINE BESYLATE 5 MG PO TABS: 5.0000 mg | ORAL_TABLET | Freq: Every day | ORAL | Status: DC

## 2013-03-20 ENCOUNTER — Other Ambulatory Visit: Payer: Self-pay | Admitting: Family Medicine

## 2013-04-06 ENCOUNTER — Encounter: Payer: Self-pay | Admitting: Medical

## 2013-04-06 ENCOUNTER — Ambulatory Visit (INDEPENDENT_AMBULATORY_CARE_PROVIDER_SITE_OTHER): Payer: Managed Care, Other (non HMO) | Admitting: Medical

## 2013-04-06 VITALS — BP 100/80 | HR 68 | Temp 97.8°F | Resp 16 | Wt 213.0 lb

## 2013-04-06 DIAGNOSIS — R053 Chronic cough: Secondary | ICD-10-CM

## 2013-04-06 DIAGNOSIS — R05 Cough: Secondary | ICD-10-CM

## 2013-04-06 DIAGNOSIS — R059 Cough, unspecified: Secondary | ICD-10-CM

## 2013-04-06 MED ORDER — BENZONATATE 200 MG PO CAPS: 200.0000 mg | ORAL_CAPSULE | Freq: Two times a day (BID) | ORAL | Status: DC | PRN

## 2013-04-06 NOTE — Patient Instructions (Signed)
Begin Zyrtec at bedtime, 10mg  daily.  This is an antihistamine.   Use salt water gargles and warm fluids such as tea and coffee to clear out the throat.   Make sure you are drinking plenty of water. Avoid spicy and acidic foods that aggravate the reflux.  Consider short term adding back Prilosec daily.    Use tessalon Perles cough drops prescribed today as needed up to 3 times daily  Do this regimen for 2-4 weeks.  If not improving, return to consider chest xray and other evaluation.

## 2013-04-06 NOTE — Progress Notes (Signed)
Subjective: Here for nagging cough for a few weeks.  Irritating.  Not necessarily having cough every day, but can come on out of the blue.   He is having some runny nose, some sneezing.  No itchy or watery eyes.  No nausea, no vomiting.   Cough is making his back hurt.  Hx/o GERD medication, but no recent worse symptoms, hasn't been using his Prilosec.  No fever, no night sweats, no weight loss.  No sick contacts.  Doesn't feel sick.  Using OTC coricidin, cough drops.  Not helping.  He is a nonsmoker.    Past Medical History  Diagnosis Date  . Hypertension   . Dyslipidemia   . FH: CAD (coronary artery disease)   . Allergy    ROS as in subjective  Objective: Filed Vitals:   04/06/13 1141  BP: 100/80  Pulse: 68  Temp: 97.8 F (36.6 C)  Resp: 16    General appearance: alert, no distress, WD/WN HEENT: normocephalic, sclerae anicteric, TMs pearly, nares patent, no discharge or erythema, pharynx normal Oral cavity: MMM, no lesions Neck: supple, no lymphadenopathy, no thyromegaly, no masses Heart: RRR, normal S1, S2, no murmurs Lungs: CTA bilaterally, no wheezes, rhonchi, or rales Abdomen: +bs, soft, non tender, non distended, no masses, no hepatomegaly, no splenomegaly Pulses: 2+ symmetric, upper and lower extremities, normal cap refill Ext: no edema   Assessment: Encounter Diagnosis  Name Primary?  . Chronic cough Yes    Plan: Discussed possible causes of chronic cough.  Likely post nasal drip.  Begin zyrtec QHS x 3-4 wk, begin back Prilosec for now x 3-4 wk, can use ibuprofen for chest wall inflammation from coughing so much, begin Tessalon Perles prn.  Follow-up prn or 3-4 wk if not improving.

## 2013-05-07 ENCOUNTER — Other Ambulatory Visit: Payer: Self-pay | Admitting: Family Medicine

## 2013-05-13 ENCOUNTER — Encounter: Payer: Self-pay | Admitting: Family Medicine

## 2013-05-17 ENCOUNTER — Ambulatory Visit (INDEPENDENT_AMBULATORY_CARE_PROVIDER_SITE_OTHER): Payer: Managed Care, Other (non HMO) | Admitting: Family Medicine

## 2013-05-17 ENCOUNTER — Encounter: Payer: Self-pay | Admitting: Family Medicine

## 2013-05-17 VITALS — BP 130/82 | HR 90 | Ht 70.0 in | Wt 210.0 lb

## 2013-05-17 DIAGNOSIS — Z0000 Encounter for general adult medical examination without abnormal findings: Secondary | ICD-10-CM

## 2013-05-17 DIAGNOSIS — E785 Hyperlipidemia, unspecified: Secondary | ICD-10-CM

## 2013-05-17 DIAGNOSIS — K219 Gastro-esophageal reflux disease without esophagitis: Secondary | ICD-10-CM

## 2013-05-17 DIAGNOSIS — I10 Essential (primary) hypertension: Secondary | ICD-10-CM

## 2013-05-17 LAB — CBC WITH DIFFERENTIAL/PLATELET
Hemoglobin: 14 g/dL (ref 13.0–17.0)
Lymphs Abs: 1.9 10*3/uL (ref 0.7–4.0)
MCH: 30.9 pg (ref 26.0–34.0)
Monocytes Relative: 7 % (ref 3–12)
Neutro Abs: 1.7 10*3/uL (ref 1.7–7.7)
Neutrophils Relative %: 43 % (ref 43–77)
RBC: 4.53 MIL/uL (ref 4.22–5.81)

## 2013-05-17 LAB — HEMOCCULT GUIAC POC 1CARD (OFFICE)

## 2013-05-17 MED ORDER — AMLODIPINE BESYLATE 5 MG PO TABS: ORAL_TABLET | ORAL | Status: DC

## 2013-05-17 MED ORDER — OMEPRAZOLE 40 MG PO CPDR: DELAYED_RELEASE_CAPSULE | ORAL | Status: DC

## 2013-05-17 MED ORDER — ROSUVASTATIN CALCIUM 10 MG PO TABS: ORAL_TABLET | ORAL | Status: DC

## 2013-05-17 NOTE — Progress Notes (Signed)
  Subjective:    Patient ID: Samuel Richards, male    DOB: [birth year], 47 y.o.   MRN: [id number]  HPI He is here for complete examination. He has enjoyed excellent health. He continues on medications listed in the chart. He rarely uses Prilosec. He is exercising regularly. He does not smoke and occasionally drinks. His work and home life are going quite well. Family history is unchanged. He does have a father that had an MI at age 11. He has no other concerns or questions.   Review of Systems Negative except as above    Objective:   Physical Exam BP 130/82  Pulse 90  Ht 5\' 10"  (1.778 m)  Wt 210 lb (95.255 kg)  BMI 30.13 kg/m2  General Appearance:    Alert, cooperative, no distress, appears stated age  Head:    Normocephalic, without obvious abnormality, atraumatic  Eyes:    PERRL, conjunctiva/corneas clear, EOM's intact, fundi    benign  Ears:    Normal TM's and external ear canals  Nose:   Nares normal, mucosa normal, no drainage or sinus   tenderness  Throat:   Lips, mucosa, and tongue normal; teeth and gums normal  Neck:   Supple, no lymphadenopathy;  thyroid:  no   enlargement/tenderness/nodules; no carotid   bruit or JVD  Back:    Spine nontender, no curvature, ROM normal, no CVA     tenderness  Lungs:     Clear to auscultation bilaterally without wheezes, rales or     ronchi; respirations unlabored  Chest Wall:    No tenderness or deformity   Heart:    Regular rate and rhythm, S1 and S2 normal, no murmur, rub   or gallop  Breast Exam:    No chest wall tenderness, masses or gynecomastia  Abdomen:     Soft, non-tender, nondistended, normoactive bowel sounds,    no masses, no hepatosplenomegaly  Genitalia:    Normal male external genitalia without lesions.  Testicles without masses.  No inguinal hernias.  Rectal:    Normal sphincter tone, no masses or tenderness; guaiac negative stool.  Prostate smooth, no nodules, not enlarged.  Extremities:   No clubbing, cyanosis or edema   Pulses:   2+ and symmetric all extremities  Skin:   Skin color, texture, turgor normal, no rashes or lesions  Lymph nodes:   Cervical, supraclavicular, and axillary nodes normal  Neurologic:   CNII-XII intact, normal strength, sensation and gait; reflexes 2+ and symmetric throughout          Psych:   Normal mood, affect, hygiene and grooming.          Assessment & Plan:  DYSLIPIDEMIA - Plan: rosuvastatin (CRESTOR) 10 MG tablet, Comprehensive metabolic panel, Lipid panel  Essential hypertension, benign - Plan: amLODipine (NORVASC) 5 MG tablet, CBC with Differential, Comprehensive metabolic panel, Lipid panel  GERD (gastroesophageal reflux disease) - Plan: omeprazole (PRILOSEC) 40 MG capsule  Routine general medical examination at a health care facility - Plan: CBC with Differential, Comprehensive metabolic panel, Lipid panel

## 2013-05-18 ENCOUNTER — Encounter: Payer: Self-pay | Admitting: Internal Medicine

## 2013-05-18 LAB — COMPREHENSIVE METABOLIC PANEL
Albumin: 4.6 g/dL (ref 3.5–5.2)
CO2: 29 mEq/L (ref 19–32)
Calcium: 9.7 mg/dL (ref 8.4–10.5)
Glucose, Bld: 90 mg/dL (ref 70–99)
Potassium: 4 mEq/L (ref 3.5–5.3)
Sodium: 138 mEq/L (ref 135–145)
Total Protein: 6.9 g/dL (ref 6.0–8.3)

## 2013-05-18 LAB — LIPID PANEL
Cholesterol: 132 mg/dL (ref 0–200)
Triglycerides: 157 mg/dL — ABNORMAL HIGH (ref <150)

## 2013-09-23 ENCOUNTER — Other Ambulatory Visit: Payer: Self-pay | Admitting: Family Medicine

## 2014-05-19 ENCOUNTER — Other Ambulatory Visit: Payer: Self-pay | Admitting: Family Medicine

## 2014-05-19 ENCOUNTER — Encounter: Payer: Self-pay | Admitting: Family Medicine

## 2014-05-19 ENCOUNTER — Ambulatory Visit (INDEPENDENT_AMBULATORY_CARE_PROVIDER_SITE_OTHER): Payer: Managed Care, Other (non HMO) | Admitting: Family Medicine

## 2014-05-19 VITALS — Ht 70.0 in | Wt 213.0 lb

## 2014-05-19 DIAGNOSIS — M25569 Pain in unspecified knee: Secondary | ICD-10-CM

## 2014-05-19 DIAGNOSIS — M25562 Pain in left knee: Secondary | ICD-10-CM

## 2014-05-19 DIAGNOSIS — G8929 Other chronic pain: Secondary | ICD-10-CM

## 2014-05-19 DIAGNOSIS — Z79899 Other long term (current) drug therapy: Secondary | ICD-10-CM

## 2014-05-19 DIAGNOSIS — I10 Essential (primary) hypertension: Secondary | ICD-10-CM

## 2014-05-19 DIAGNOSIS — M542 Cervicalgia: Secondary | ICD-10-CM

## 2014-05-19 DIAGNOSIS — Z0000 Encounter for general adult medical examination without abnormal findings: Secondary | ICD-10-CM

## 2014-05-19 DIAGNOSIS — E785 Hyperlipidemia, unspecified: Secondary | ICD-10-CM

## 2014-05-19 LAB — CBC WITH DIFFERENTIAL/PLATELET
BASOS PCT: 0 % (ref 0–1)
Basophils Absolute: 0 10*3/uL (ref 0.0–0.1)
EOS ABS: 0 10*3/uL (ref 0.0–0.7)
Eosinophils Relative: 1 % (ref 0–5)
HEMATOCRIT: 42.7 % (ref 39.0–52.0)
HEMOGLOBIN: 14.7 g/dL (ref 13.0–17.0)
LYMPHS ABS: 1.7 10*3/uL (ref 0.7–4.0)
Lymphocytes Relative: 40 % (ref 12–46)
MCH: 30.6 pg (ref 26.0–34.0)
MCHC: 34.4 g/dL (ref 30.0–36.0)
MCV: 88.8 fL (ref 78.0–100.0)
MONO ABS: 0.3 10*3/uL (ref 0.1–1.0)
MONOS PCT: 6 % (ref 3–12)
NEUTROS PCT: 53 % (ref 43–77)
Neutro Abs: 2.2 10*3/uL (ref 1.7–7.7)
Platelets: 262 10*3/uL (ref 150–400)
RBC: 4.81 MIL/uL (ref 4.22–5.81)
RDW: 14.2 % (ref 11.5–15.5)
WBC: 4.2 10*3/uL (ref 4.0–10.5)

## 2014-05-19 LAB — HEMOCCULT GUIAC POC 1CARD (OFFICE)

## 2014-05-19 MED ORDER — ROSUVASTATIN CALCIUM 10 MG PO TABS: ORAL_TABLET | ORAL | Status: DC

## 2014-05-19 MED ORDER — AMLODIPINE BESYLATE 5 MG PO TABS: ORAL_TABLET | ORAL | Status: DC

## 2014-05-19 NOTE — Progress Notes (Signed)
Subjective:    Patient ID: Samuel Richards, male    DOB: 05/09/1966, 48 y.o.   MRN: [id number]  HPI  He is here for complete examination. He does check his blood pressure daily. He does have his own machine. It is compared ours and does compare favorably. He also is had intermittent difficulty with neck pain and has questions concerning going to chiropractor. He also notes intermittent left knee pain with occasional popping however this does not interfere with his physical functioning. He notes that his reflux symptoms are better now that he is using a pillow and raising his head. He continues on medications listed in the chart. Family and social history were reviewed. Life in general is going quite well for him.   Review of Systems  All other systems reviewed and are negative.      Objective:   Physical Exam Ht 5\' 10"  (1.778 m)  Wt 213 lb (96.616 kg)  BMI 30.56 kg/m2  General Appearance:    Alert, cooperative, no distress, appears stated age  Head:    Normocephalic, without obvious abnormality, atraumatic  Eyes:    PERRL, conjunctiva/corneas clear, EOM's intact, fundi    benign  Ears:    Normal TM's and external ear canals  Nose:   Nares normal, mucosa normal, no drainage or sinus   tenderness  Throat:   Lips, mucosa, and tongue normal; teeth and gums normal  Neck:   Supple, no lymphadenopathy;  thyroid:  no   enlargement/tenderness/nodules; no carotid   bruit or JVD  Back:    Spine nontender, no curvature, ROM normal, no CVA     tenderness  Lungs:     Clear to auscultation bilaterally without wheezes, rales or     ronchi; respirations unlabored  Chest Wall:    No tenderness or deformity   Heart:    Regular rate and rhythm, S1 and S2 normal, no murmur, rub   or gallop  Breast Exam:    No chest wall tenderness, masses or gynecomastia  Abdomen:     Soft, non-tender, nondistended, normoactive bowel sounds,    no masses, no hepatosplenomegaly  Genitalia:    Normal male external  genitalia without lesions.  Testicles without masses.  No inguinal hernias.  Rectal:    Normal sphincter tone, no masses or tenderness; guaiac negative stool.  Prostate smooth, no nodules, not enlarged.  Extremities:   No clubbing, cyanosis or edema. Left knee exam shows no effusion or palpable tenderness. Ligaments intact. Negative McMurray's testing.   Pulses:   2+ and symmetric all extremities  Skin:   Skin color, texture, turgor normal, no rashes or lesions  Lymph nodes:   Cervical, supraclavicular, and axillary nodes normal  Neurologic:   CNII-XII intact, normal strength, sensation and gait; reflexes 2+ and symmetric throughout          Psych:   Normal mood, affect, hygiene and grooming.          Assessment & Plan:  DYSLIPIDEMIA - Plan: rosuvastatin (CRESTOR) 10 MG tablet, Lipid panel  Essential hypertension, benign - Plan: amLODipine (NORVASC) 5 MG tablet, CBC with Differential, Comprehensive metabolic panel  Routine general medical examination at a health care facility - Plan: CBC with Differential, Comprehensive metabolic panel, Lipid panel, POCT occult blood stool  Encounter for long-term (current) use of other medications - Plan: CBC with Differential, Comprehensive metabolic panel, Lipid panel  Left knee pain  Neck pain of over 3 months duration  encouraged him to remain  physically active. Discussed chiropractic manipulation for his neck and explained that this might potentially help. He will keep track of his blood pressure but only take it once per month. No therapy for the knee pain until starts giving him more difficulty. He is comfortable with this.

## 2014-05-20 LAB — COMPREHENSIVE METABOLIC PANEL
ALT: 40 U/L (ref 0–53)
AST: 23 U/L (ref 0–37)
Albumin: 5 g/dL (ref 3.5–5.2)
Alkaline Phosphatase: 70 U/L (ref 39–117)
BILIRUBIN TOTAL: 1.5 mg/dL — AB (ref 0.2–1.2)
BUN: 13 mg/dL (ref 6–23)
CHLORIDE: 102 meq/L (ref 96–112)
CO2: 27 meq/L (ref 19–32)
Calcium: 9.8 mg/dL (ref 8.4–10.5)
Creat: 1.18 mg/dL (ref 0.50–1.35)
Glucose, Bld: 93 mg/dL (ref 70–99)
Potassium: 4.2 mEq/L (ref 3.5–5.3)
SODIUM: 137 meq/L (ref 135–145)
Total Protein: 7.5 g/dL (ref 6.0–8.3)

## 2014-05-20 LAB — LIPID PANEL
CHOLESTEROL: 121 mg/dL (ref 0–200)
HDL: 44 mg/dL (ref >39)
LDL Cholesterol: 47 mg/dL (ref 0–99)
Total CHOL/HDL Ratio: 2.8 Ratio
Triglycerides: 150 mg/dL — ABNORMAL HIGH (ref <150)
VLDL: 30 mg/dL (ref 0–40)

## 2014-07-29 ENCOUNTER — Other Ambulatory Visit: Payer: Self-pay

## 2014-09-07 ENCOUNTER — Other Ambulatory Visit: Payer: Self-pay | Admitting: Family Medicine

## 2015-04-03 ENCOUNTER — Other Ambulatory Visit: Payer: Self-pay | Admitting: Family Medicine

## 2015-05-23 ENCOUNTER — Encounter: Payer: Self-pay | Admitting: Family Medicine

## 2015-05-23 ENCOUNTER — Ambulatory Visit (INDEPENDENT_AMBULATORY_CARE_PROVIDER_SITE_OTHER): Payer: BLUE CROSS/BLUE SHIELD | Admitting: Family Medicine

## 2015-05-23 VITALS — BP 118/74 | HR 86 | Ht 70.0 in | Wt 209.0 lb

## 2015-05-23 DIAGNOSIS — E785 Hyperlipidemia, unspecified: Secondary | ICD-10-CM | POA: Diagnosis not present

## 2015-05-23 DIAGNOSIS — K219 Gastro-esophageal reflux disease without esophagitis: Secondary | ICD-10-CM

## 2015-05-23 DIAGNOSIS — Z0000 Encounter for general adult medical examination without abnormal findings: Secondary | ICD-10-CM

## 2015-05-23 DIAGNOSIS — I10 Essential (primary) hypertension: Secondary | ICD-10-CM

## 2015-05-23 LAB — CBC WITH DIFFERENTIAL/PLATELET
Basophils Absolute: 0 10*3/uL (ref 0.0–0.1)
Basophils Relative: 1 % (ref 0–1)
EOS ABS: 0 10*3/uL (ref 0.0–0.7)
Eosinophils Relative: 1 % (ref 0–5)
HCT: 41.4 % (ref 39.0–52.0)
HEMOGLOBIN: 14.2 g/dL (ref 13.0–17.0)
LYMPHS PCT: 46 % (ref 12–46)
Lymphs Abs: 1.8 10*3/uL (ref 0.7–4.0)
MCH: 30.7 pg (ref 26.0–34.0)
MCHC: 34.3 g/dL (ref 30.0–36.0)
MCV: 89.4 fL (ref 78.0–100.0)
MPV: 10 fL (ref 8.6–12.4)
Monocytes Absolute: 0.3 10*3/uL (ref 0.1–1.0)
Monocytes Relative: 7 % (ref 3–12)
Neutro Abs: 1.8 10*3/uL (ref 1.7–7.7)
Neutrophils Relative %: 45 % (ref 43–77)
PLATELETS: 267 10*3/uL (ref 150–400)
RBC: 4.63 MIL/uL (ref 4.22–5.81)
RDW: 13.9 % (ref 11.5–15.5)
WBC: 3.9 10*3/uL — ABNORMAL LOW (ref 4.0–10.5)

## 2015-05-23 MED ORDER — AMLODIPINE BESYLATE 5 MG PO TABS: ORAL_TABLET | ORAL | Status: DC

## 2015-05-23 MED ORDER — ROSUVASTATIN CALCIUM 10 MG PO TABS: ORAL_TABLET | ORAL | Status: DC

## 2015-05-23 MED ORDER — OMEPRAZOLE 40 MG PO CPDR: DELAYED_RELEASE_CAPSULE | ORAL | Status: DC

## 2015-05-23 NOTE — Progress Notes (Signed)
   Subjective:    Patient ID: Samuel Richards, male    DOB: 1966-04-04, 49 y.o.   MRN: [id number]  HPI He is here for complete examination. He is now involved in a exercise program and has also made some dietary changes. His waist size has reduced to almost 34. He feels quite good about this and plans to this regimen. He continues on Crestor and is having no difficulty with this as well as his Norvasc. His reflux symptoms are doing much better with dietary modification. He has no other concerns or complaints. No chest pain, nausea, urinary symptoms. Family and social history as well as immunizations and health maintenance were reviewed. His work and home life are going quite well. He had a colonoscopy in 2013.  Review of Systems  All other systems reviewed and are negative.      Objective:   Physical Exam BP 118/74 mmHg  Pulse 86  Ht 5\' 10"  (1.778 m)  Wt 209 lb (94.802 kg)  BMI 29.99 kg/m2  SpO2 97%  General Appearance:    Alert, cooperative, no distress, appears stated age  Head:    Normocephalic, without obvious abnormality, atraumatic  Eyes:    PERRL, conjunctiva/corneas clear, EOM's intact, fundi    benign  Ears:    Normal TM's and external ear canals  Nose:   Nares normal, mucosa normal, no drainage or sinus   tenderness  Throat:   Lips, mucosa, and tongue normal; teeth and gums normal  Neck:   Supple, no lymphadenopathy;  thyroid:  no   enlargement/tenderness/nodules; no carotid   bruit or JVD  Back:    Spine nontender, no curvature, ROM normal, no CVA     tenderness  Lungs:     Clear to auscultation bilaterally without wheezes, rales or     ronchi; respirations unlabored  Chest Wall:    No tenderness or deformity   Heart:    Regular rate and rhythm, S1 and S2 normal, no murmur, rub   or gallop  Breast Exam:    No chest wall tenderness, masses or gynecomastia  Abdomen:     Soft, non-tender, nondistended, normoactive bowel sounds,    no masses, no hepatosplenomegaly        Extremities:   No clubbing, cyanosis or edema  Pulses:   2+ and symmetric all extremities  Skin:   Skin color, texture, turgor normal, no rashes or lesions  Lymph nodes:   Cervical, supraclavicular, and axillary nodes normal  Neurologic:   CNII-XII intact, normal strength, sensation and gait; reflexes 2+ and symmetric throughout          Psych:   Normal mood, affect, hygiene and grooming.          Assessment & Plan:  Routine general medical examination at a health care facility - Plan: CBC with Differential/Platelet, Comprehensive metabolic panel, Lipid panel  Hyperlipidemia LDL goal <130 - Plan: Lipid panel, rosuvastatin (CRESTOR) 10 MG tablet  Essential hypertension, benign - Plan: CBC with Differential/Platelet, Comprehensive metabolic panel, amLODipine (NORVASC) 5 MG tablet  Gastroesophageal reflux disease without esophagitis - Plan: omeprazole (PRILOSEC) 40 MG capsule  I complemented him on the good work that he was doing. Encouraged him to get to and stay at a waist size of 34.

## 2015-05-24 LAB — COMPREHENSIVE METABOLIC PANEL
ALBUMIN: 4.5 g/dL (ref 3.6–5.1)
ALK PHOS: 65 U/L (ref 40–115)
ALT: 21 U/L (ref 9–46)
AST: 18 U/L (ref 10–40)
BILIRUBIN TOTAL: 1.5 mg/dL — AB (ref 0.2–1.2)
BUN: 11 mg/dL (ref 7–25)
CO2: 27 mmol/L (ref 20–31)
Calcium: 9.4 mg/dL (ref 8.6–10.3)
Chloride: 104 mmol/L (ref 98–110)
Creat: 1.08 mg/dL (ref 0.60–1.35)
Glucose, Bld: 94 mg/dL (ref 65–99)
Potassium: 4.3 mmol/L (ref 3.5–5.3)
Sodium: 144 mmol/L (ref 135–146)
Total Protein: 7 g/dL (ref 6.1–8.1)

## 2015-05-24 LAB — LIPID PANEL
CHOLESTEROL: 104 mg/dL — AB (ref 125–200)
HDL: 39 mg/dL — ABNORMAL LOW (ref >=40)
LDL Cholesterol: 47 mg/dL (ref <130)
TRIGLYCERIDES: 92 mg/dL (ref <150)
Total CHOL/HDL Ratio: 2.7 Ratio (ref <=5.0)
VLDL: 18 mg/dL (ref <30)

## 2015-06-30 ENCOUNTER — Other Ambulatory Visit: Payer: Self-pay | Admitting: Family Medicine

## 2016-04-26 ENCOUNTER — Ambulatory Visit: Payer: BLUE CROSS/BLUE SHIELD | Admitting: Medical

## 2016-06-08 ENCOUNTER — Other Ambulatory Visit: Payer: Self-pay | Admitting: Family Medicine

## 2016-06-08 DIAGNOSIS — E785 Hyperlipidemia, unspecified: Secondary | ICD-10-CM

## 2016-06-10 NOTE — Telephone Encounter (Signed)
Called and left a Detailed message that pt is due for CPE or MED CHECK to get his med refilled anymore. I will refill it for 30 days

## 2016-07-02 DIAGNOSIS — H04123 Dry eye syndrome of bilateral lacrimal glands: Secondary | ICD-10-CM | POA: Diagnosis not present

## 2016-07-07 ENCOUNTER — Other Ambulatory Visit: Payer: Self-pay | Admitting: Family Medicine

## 2016-07-07 DIAGNOSIS — E785 Hyperlipidemia, unspecified: Secondary | ICD-10-CM

## 2016-07-08 NOTE — Telephone Encounter (Signed)
Patient has an appt tomorrow for cpe

## 2016-07-09 ENCOUNTER — Encounter: Payer: Self-pay | Admitting: Family Medicine

## 2016-07-09 ENCOUNTER — Ambulatory Visit (INDEPENDENT_AMBULATORY_CARE_PROVIDER_SITE_OTHER): Payer: BLUE CROSS/BLUE SHIELD | Admitting: Family Medicine

## 2016-07-09 VITALS — BP 120/82 | HR 93 | Wt 213.2 lb

## 2016-07-09 DIAGNOSIS — Z79899 Other long term (current) drug therapy: Secondary | ICD-10-CM

## 2016-07-09 DIAGNOSIS — K219 Gastro-esophageal reflux disease without esophagitis: Secondary | ICD-10-CM | POA: Diagnosis not present

## 2016-07-09 DIAGNOSIS — E785 Hyperlipidemia, unspecified: Secondary | ICD-10-CM

## 2016-07-09 DIAGNOSIS — Z833 Family history of diabetes mellitus: Secondary | ICD-10-CM

## 2016-07-09 DIAGNOSIS — J309 Allergic rhinitis, unspecified: Secondary | ICD-10-CM

## 2016-07-09 DIAGNOSIS — Z23 Encounter for immunization: Secondary | ICD-10-CM

## 2016-07-09 DIAGNOSIS — Z125 Encounter for screening for malignant neoplasm of prostate: Secondary | ICD-10-CM | POA: Diagnosis not present

## 2016-07-09 DIAGNOSIS — I10 Essential (primary) hypertension: Secondary | ICD-10-CM

## 2016-07-09 LAB — COMPREHENSIVE METABOLIC PANEL
ALBUMIN: 4.8 g/dL (ref 3.6–5.1)
ALK PHOS: 57 U/L (ref 40–115)
ALT: 34 U/L (ref 9–46)
AST: 26 U/L (ref 10–35)
BILIRUBIN TOTAL: 1.3 mg/dL — AB (ref 0.2–1.2)
BUN: 15 mg/dL (ref 7–25)
CALCIUM: 9.7 mg/dL (ref 8.6–10.3)
CO2: 25 mmol/L (ref 20–31)
CREATININE: 1.23 mg/dL (ref 0.70–1.33)
Chloride: 101 mmol/L (ref 98–110)
Glucose, Bld: 86 mg/dL (ref 65–99)
POTASSIUM: 4 mmol/L (ref 3.5–5.3)
Sodium: 138 mmol/L (ref 135–146)
Total Protein: 7.3 g/dL (ref 6.1–8.1)

## 2016-07-09 LAB — CBC WITH DIFFERENTIAL/PLATELET
BASOS ABS: 0 {cells}/uL (ref 0–200)
Basophils Relative: 0 %
EOS ABS: 41 {cells}/uL (ref 15–500)
Eosinophils Relative: 1 %
HEMATOCRIT: 40.5 % (ref 38.5–50.0)
HEMOGLOBIN: 14.3 g/dL (ref 13.2–17.1)
LYMPHS ABS: 1927 {cells}/uL (ref 850–3900)
Lymphocytes Relative: 47 %
MCH: 31.2 pg (ref 27.0–33.0)
MCHC: 35.3 g/dL (ref 32.0–36.0)
MCV: 88.4 fL (ref 80.0–100.0)
MONO ABS: 246 {cells}/uL (ref 200–950)
MPV: 9.9 fL (ref 7.5–12.5)
Monocytes Relative: 6 %
NEUTROS PCT: 46 %
Neutro Abs: 1886 cells/uL (ref 1500–7800)
Platelets: 240 10*3/uL (ref 140–400)
RBC: 4.58 MIL/uL (ref 4.20–5.80)
RDW: 13.7 % (ref 11.0–15.0)
WBC: 4.1 10*3/uL (ref 4.0–10.5)

## 2016-07-09 LAB — LIPID PANEL
CHOLESTEROL: 137 mg/dL (ref 125–200)
HDL: 43 mg/dL (ref >=40)
LDL Cholesterol: 68 mg/dL (ref <130)
Total CHOL/HDL Ratio: 3.2 Ratio (ref <=5.0)
Triglycerides: 130 mg/dL (ref <150)
VLDL: 26 mg/dL (ref <30)

## 2016-07-09 LAB — PSA: PSA: 0.9 ng/mL (ref <=4.0)

## 2016-07-09 MED ORDER — ROSUVASTATIN CALCIUM 10 MG PO TABS: ORAL_TABLET | ORAL | 3 refills | Status: DC

## 2016-07-09 MED ORDER — AMLODIPINE BESYLATE 5 MG PO TABS
ORAL_TABLET | ORAL | 3 refills | Status: DC
Start: 2016-07-09 — End: 2017-08-28

## 2016-07-09 MED ORDER — OMEPRAZOLE 40 MG PO CPDR: DELAYED_RELEASE_CAPSULE | ORAL | 3 refills | Status: DC

## 2016-07-09 NOTE — Progress Notes (Signed)
   Subjective:    Patient ID: Samuel Richards, male    DOB: 07/04/1966, 49 y.o.   MRN: [id number]  HPI he is here for an interval evaluation. He does have underlying hyperlipidemia and is doing quite nicely on Crestor with no myalgias or arthralgias.Marland Kitchen He continues on his Norvasc for his hypertension and again has no chest pain, shortness of breath, weakness. His reflux causes very little difficulty. He uses Prilosec very sparingly. He does have underlying allergies and is having no difficulty with them. Review of past medical history and family history indicates risk for diabetes with several members of his family having that disease.  Review of Systems     Objective:   Physical Exam Alert and in no distress. Tympanic membranes and canals are normal. Pharyngeal area is normal. Neck is supple without adenopathy or thyromegaly. Cardiac exam shows a regular sinus rhythm without murmurs or gallops. Lungs are clear to auscultation. Dominant exam shows no masses or tenderness.       Assessment & Plan:  Needs flu shot - Plan: Flu Vaccine QUAD 36+ mos IM  Hyperlipidemia LDL goal <130 - Plan: Lipid panel, rosuvastatin (CRESTOR) 10 MG tablet  Essential hypertension, benign - Plan: CBC with Differential/Platelet, Comprehensive metabolic panel, amLODipine (NORVASC) 5 MG tablet  Gastroesophageal reflux disease without esophagitis - Plan: omeprazole (PRILOSEC) 40 MG capsule  Encounter for long-term (current) use of medications - Plan: CBC with Differential/Platelet, Comprehensive metabolic panel, Lipid panel  Screening for prostate cancer - Plan: PSA  Allergic rhinitis, unspecified allergic rhinitis type  Family history of diabetes mellitus Discussed prostate cancer screening and we did this. Also discussed continuing to manage his lifestyle and keep his weight under control as there is a family history of diabetes.

## 2016-09-04 ENCOUNTER — Other Ambulatory Visit: Payer: Self-pay | Admitting: Family Medicine

## 2016-10-15 DIAGNOSIS — H04123 Dry eye syndrome of bilateral lacrimal glands: Secondary | ICD-10-CM | POA: Diagnosis not present

## 2016-11-18 ENCOUNTER — Telehealth: Payer: Self-pay | Admitting: Family Medicine

## 2016-11-18 DIAGNOSIS — Z1211 Encounter for screening for malignant neoplasm of colon: Secondary | ICD-10-CM | POA: Diagnosis not present

## 2016-11-18 NOTE — Telephone Encounter (Signed)
Pt came in and dropped off a form to be completed. Pt states he had labs in Sept. Please complete form and then fax to number at top of form.

## 2016-12-24 DIAGNOSIS — K5730 Diverticulosis of large intestine without perforation or abscess without bleeding: Secondary | ICD-10-CM | POA: Diagnosis not present

## 2016-12-24 DIAGNOSIS — Z1211 Encounter for screening for malignant neoplasm of colon: Secondary | ICD-10-CM | POA: Diagnosis not present

## 2016-12-24 LAB — HM COLONOSCOPY

## 2017-01-08 ENCOUNTER — Telehealth: Payer: Self-pay | Admitting: Family Medicine

## 2017-01-08 NOTE — Telephone Encounter (Signed)
Pt dropped off form to be filled out states he just had a med check with fasting labs, pt refused CPE because he just had the med ceck in september pt can be reached at [phone] when ready to be picked up, put in your folder

## 2017-06-07 ENCOUNTER — Other Ambulatory Visit: Payer: Self-pay | Admitting: Family Medicine

## 2017-06-24 ENCOUNTER — Encounter: Payer: Self-pay | Admitting: Family Medicine

## 2017-06-24 ENCOUNTER — Ambulatory Visit (INDEPENDENT_AMBULATORY_CARE_PROVIDER_SITE_OTHER): Payer: BLUE CROSS/BLUE SHIELD | Admitting: Family Medicine

## 2017-06-24 VITALS — BP 130/80 | HR 88 | Temp 98.7°F | Wt 222.0 lb

## 2017-06-24 DIAGNOSIS — J209 Acute bronchitis, unspecified: Secondary | ICD-10-CM | POA: Diagnosis not present

## 2017-06-24 MED ORDER — AMOXICILLIN 875 MG PO TABS: 875.0000 mg | ORAL_TABLET | Freq: Two times a day (BID) | ORAL | 0 refills | Status: DC

## 2017-06-24 NOTE — Progress Notes (Signed)
   Subjective:    Patient ID: Samuel Richards, male    DOB: October 21, 1965, 51 y.o.   MRN: [id number]  HPI He complains of a three-week history of difficulty with cough but no sore throat, fever, chills, earache, shortness of breath, wheezing, sneezing, itchy watery eyes. He has no history of allergies and does not smoke.   Review of Systems     Objective:   Physical Exam Alert and in no distress. Tympanic membranes and canals are normal. Pharyngeal area is normal. Neck is supple without adenopathy or thyromegaly. Cardiac exam shows a regular sinus rhythm without murmurs or gallops. Lungs are clear to auscultation.        Assessment & Plan:  Acute bronchitis, unspecified organism - Plan: amoxicillin (AMOXIL) 875 MG tablet I explained that this could be an atypical bronchitis and worthwhile treating. If he does not improve, he'll return for further evaluation including possible blood work and x-rays. I also encouraged him to come back for complete examination.

## 2017-07-15 DIAGNOSIS — H04123 Dry eye syndrome of bilateral lacrimal glands: Secondary | ICD-10-CM | POA: Diagnosis not present

## 2017-08-28 ENCOUNTER — Encounter: Payer: Self-pay | Admitting: Family Medicine

## 2017-08-28 ENCOUNTER — Ambulatory Visit: Payer: BLUE CROSS/BLUE SHIELD | Admitting: Family Medicine

## 2017-08-28 VITALS — BP 150/70 | HR 86 | Resp 16 | Ht 68.5 in | Wt 213.0 lb

## 2017-08-28 DIAGNOSIS — I10 Essential (primary) hypertension: Secondary | ICD-10-CM

## 2017-08-28 DIAGNOSIS — Z8249 Family history of ischemic heart disease and other diseases of the circulatory system: Secondary | ICD-10-CM | POA: Diagnosis not present

## 2017-08-28 DIAGNOSIS — Z0000 Encounter for general adult medical examination without abnormal findings: Secondary | ICD-10-CM

## 2017-08-28 DIAGNOSIS — J309 Allergic rhinitis, unspecified: Secondary | ICD-10-CM

## 2017-08-28 DIAGNOSIS — K219 Gastro-esophageal reflux disease without esophagitis: Secondary | ICD-10-CM

## 2017-08-28 DIAGNOSIS — E785 Hyperlipidemia, unspecified: Secondary | ICD-10-CM

## 2017-08-28 LAB — COMPREHENSIVE METABOLIC PANEL
AG RATIO: 1.8 (calc) (ref 1.0–2.5)
ALKALINE PHOSPHATASE (APISO): 71 U/L (ref 40–115)
ALT: 22 U/L (ref 9–46)
AST: 17 U/L (ref 10–35)
Albumin: 4.6 g/dL (ref 3.6–5.1)
BUN: 12 mg/dL (ref 7–25)
CHLORIDE: 103 mmol/L (ref 98–110)
CO2: 28 mmol/L (ref 20–32)
CREATININE: 1.1 mg/dL (ref 0.70–1.33)
Calcium: 9.5 mg/dL (ref 8.6–10.3)
GLOBULIN: 2.5 g/dL (ref 1.9–3.7)
GLUCOSE: 88 mg/dL (ref 65–99)
Potassium: 4.1 mmol/L (ref 3.5–5.3)
Sodium: 139 mmol/L (ref 135–146)
Total Bilirubin: 1.4 mg/dL — ABNORMAL HIGH (ref 0.2–1.2)
Total Protein: 7.1 g/dL (ref 6.1–8.1)

## 2017-08-28 LAB — POCT URINALYSIS DIP (PROADVANTAGE DEVICE)
BILIRUBIN UA: NEGATIVE
BILIRUBIN UA: NEGATIVE mg/dL
Glucose, UA: NEGATIVE mg/dL
LEUKOCYTES UA: NEGATIVE
Nitrite, UA: NEGATIVE
Protein Ur, POC: NEGATIVE mg/dL
RBC UA: NEGATIVE
Specific Gravity, Urine: 1.02
Urobilinogen, Ur: NEGATIVE
pH, UA: 6 (ref 5.0–8.0)

## 2017-08-28 LAB — CBC WITH DIFFERENTIAL/PLATELET
BASOS ABS: 19 {cells}/uL (ref 0–200)
Basophils Relative: 0.5 %
EOS ABS: 30 {cells}/uL (ref 15–500)
Eosinophils Relative: 0.8 %
HCT: 40.3 % (ref 38.5–50.0)
Hemoglobin: 14.1 g/dL (ref 13.2–17.1)
Lymphs Abs: 1561 cells/uL (ref 850–3900)
MCH: 30.9 pg (ref 27.0–33.0)
MCHC: 35 g/dL (ref 32.0–36.0)
MCV: 88.2 fL (ref 80.0–100.0)
MONOS PCT: 6.5 %
MPV: 10.2 fL (ref 7.5–12.5)
Neutro Abs: 1850 cells/uL (ref 1500–7800)
Neutrophils Relative %: 50 %
PLATELETS: 266 10*3/uL (ref 140–400)
RBC: 4.57 10*6/uL (ref 4.20–5.80)
RDW: 13.1 % (ref 11.0–15.0)
TOTAL LYMPHOCYTE: 42.2 %
WBC: 3.7 10*3/uL — ABNORMAL LOW (ref 3.8–10.8)
WBCMIX: 241 {cells}/uL (ref 200–950)

## 2017-08-28 LAB — LIPID PANEL
CHOLESTEROL: 116 mg/dL (ref <200)
HDL: 40 mg/dL — AB (ref >40)
LDL Cholesterol (Calc): 57 mg/dL (calc)
NON-HDL CHOLESTEROL (CALC): 76 mg/dL (ref <130)
TRIGLYCERIDES: 111 mg/dL (ref <150)
Total CHOL/HDL Ratio: 2.9 (calc) (ref <5.0)

## 2017-08-28 MED ORDER — AMLODIPINE BESYLATE 5 MG PO TABS: ORAL_TABLET | ORAL | 3 refills | Status: DC

## 2017-08-28 MED ORDER — OMEPRAZOLE 40 MG PO CPDR: DELAYED_RELEASE_CAPSULE | ORAL | 3 refills | Status: DC

## 2017-08-28 MED ORDER — ROSUVASTATIN CALCIUM 10 MG PO TABS: ORAL_TABLET | ORAL | 3 refills | Status: DC

## 2017-08-28 NOTE — Progress Notes (Signed)
urin

## 2017-08-28 NOTE — Progress Notes (Addendum)
Subjective:    Patient ID: Samuel Richards, male    DOB: 1966-03-18, 51 y.o.   MRN: [id number]  HPI He is here for complete examination.  He does have underlying allergies and is having no difficulty with them.  He continues on Crestor and has had no aches or pains on that medication.  He does use Prilosec on an as-needed basis for his reflux symptoms.  He continues on amlodipine for his blood pressure.  Does have a positive family history for heart disease and was evaluated in 2011 with a stress test which was negative.  He does complain of intermittent difficulty with xiphoid pain and relates this to more position than anything else.  Otherwise he has no particular concerns or complaints.  Family and social history as well as health maintenance and immunizations was reviewed.  He keeps himself physically active but is not involved in a regular exercise program.   Review of Systems  All other systems reviewed and are negative.      Objective:   Physical Exam BP (!) 150/70   Pulse 86   Resp 16   Ht 5' 8.5" (1.74 m)   Wt 213 lb (96.6 kg)   SpO2 98%   BMI 31.92 kg/m   General Appearance:    Alert, cooperative, no distress, appears stated age  Head:    Normocephalic, without obvious abnormality, atraumatic  Eyes:    PERRL, conjunctiva/corneas clear, EOM's intact, fundi    benign  Ears:    Normal TM's and external ear canals  Nose:   Nares normal, mucosa normal, no drainage or sinus   tenderness  Throat:   Lips, mucosa, and tongue normal; teeth and gums normal  Neck:   Supple, no lymphadenopathy;  thyroid:  no   enlargement/tenderness/nodules; no carotid   bruit or JVD     Lungs:     Clear to auscultation bilaterally without wheezes, rales or     ronchi; respirations unlabored      Heart:    Regular rate and rhythm, S1 and S2 normal, no murmur, rub   or gallop     Abdomen:     Soft, non-tender, nondistended, normoactive bowel sounds,    no masses, no hepatosplenomegaly    Genitalia:    Normal male external genitalia without lesions.  Testicles without masses.  No inguinal hernias.  Rectal:   Deferred  Extremities:   No clubbing, cyanosis or edema  Pulses:   2+ and symmetric all extremities  Skin:   Skin color, texture, turgor normal, no rashes or lesions  Lymph nodes:   Cervical, supraclavicular, and axillary nodes normal  Neurologic:   CNII-XII intact, normal strength, sensation and gait; reflexes 2+ and symmetric throughout          Psych:   Normal mood, affect, hygiene and grooming.   EKG shows no acute changes.        Assessment & Plan:  Routine general medical examination at a health care facility - Plan: EKG 12-Lead, CBC with Differential/Platelet, Comprehensive metabolic panel, Lipid panel, POCT Urinalysis DIP (Proadvantage Device)  Hyperlipidemia LDL goal <130 - Plan: Lipid panel, rosuvastatin (CRESTOR) 10 MG tablet  Essential hypertension, benign - Plan: CBC with Differential/Platelet, Comprehensive metabolic panel, amLODipine (NORVASC) 5 MG tablet  Gastroesophageal reflux disease without esophagitis - Plan: omeprazole (PRILOSEC) 40 MG capsule  Family history of heart disease in male family member before age 76 - Plan: EKG 12-Lead, CBC with Differential/Platelet, Comprehensive metabolic panel, Lipid panel  Allergic rhinitis, unspecified seasonality, unspecified trigger I discussed diet and exercise with him.  His blood pressure today is slightly elevated and he is to return in 2 months for recheck on that.  He has already had a flu shot. Recommend proper posturing for his xiphoid symptoms.

## 2017-08-29 ENCOUNTER — Encounter: Payer: Self-pay | Admitting: Family Medicine

## 2017-09-01 ENCOUNTER — Encounter: Payer: Self-pay | Admitting: Family Medicine

## 2017-09-23 ENCOUNTER — Encounter: Payer: Self-pay | Admitting: Family Medicine

## 2017-10-28 ENCOUNTER — Other Ambulatory Visit: Payer: BLUE CROSS/BLUE SHIELD

## 2018-02-05 ENCOUNTER — Encounter: Payer: Self-pay | Admitting: Family Medicine

## 2018-05-19 ENCOUNTER — Ambulatory Visit: Payer: BLUE CROSS/BLUE SHIELD | Admitting: Family Medicine

## 2018-05-19 VITALS — BP 138/84 | HR 90 | Temp 98.7°F | Resp 16 | Ht 69.0 in | Wt 218.4 lb

## 2018-05-19 DIAGNOSIS — I10 Essential (primary) hypertension: Secondary | ICD-10-CM | POA: Diagnosis not present

## 2018-05-19 NOTE — Progress Notes (Signed)
   Subjective:    Patient ID: Samuel Richards, male    DOB: 1966/01/10, 52 y.o.   MRN: [id number]  HPI Over the weekend he had some nonspecific symptoms of dizziness and not feeling right and checked his blood pressure.  He did note that it was elevated and is here for further consultation.  He does take Norvasc.   Review of Systems     Objective:   Physical Exam Alert and in no distress.  Blood pressure is recorded.       Assessment & Plan:  Essential hypertension, benign I explained that his blood pressure is slightly elevated.  Recommend he continue with his diet and exercise.  We can recheck that with his next visit which will be within the next several months.  Explained that the symptoms and he is having are not at all related to his blood pressure.  I explained that we might in the future need to add to his medication regimen.

## 2018-07-29 ENCOUNTER — Other Ambulatory Visit: Payer: Self-pay | Admitting: Family Medicine

## 2018-07-29 DIAGNOSIS — E785 Hyperlipidemia, unspecified: Secondary | ICD-10-CM

## 2018-08-20 ENCOUNTER — Encounter: Payer: Self-pay | Admitting: Family Medicine

## 2018-09-08 ENCOUNTER — Encounter: Payer: Self-pay | Admitting: Family Medicine

## 2018-09-08 ENCOUNTER — Ambulatory Visit (INDEPENDENT_AMBULATORY_CARE_PROVIDER_SITE_OTHER): Payer: BLUE CROSS/BLUE SHIELD | Admitting: Family Medicine

## 2018-09-08 VITALS — BP 112/80 | HR 84 | Temp 98.4°F | Ht 69.0 in | Wt 217.8 lb

## 2018-09-08 DIAGNOSIS — Z0000 Encounter for general adult medical examination without abnormal findings: Secondary | ICD-10-CM

## 2018-09-08 DIAGNOSIS — K219 Gastro-esophageal reflux disease without esophagitis: Secondary | ICD-10-CM

## 2018-09-08 DIAGNOSIS — I10 Essential (primary) hypertension: Secondary | ICD-10-CM | POA: Diagnosis not present

## 2018-09-08 DIAGNOSIS — E785 Hyperlipidemia, unspecified: Secondary | ICD-10-CM | POA: Diagnosis not present

## 2018-09-08 DIAGNOSIS — J309 Allergic rhinitis, unspecified: Secondary | ICD-10-CM

## 2018-09-08 DIAGNOSIS — Z833 Family history of diabetes mellitus: Secondary | ICD-10-CM

## 2018-09-08 LAB — COMPREHENSIVE METABOLIC PANEL
A/G RATIO: 2 (ref 1.2–2.2)
ALBUMIN: 4.9 g/dL (ref 3.5–5.5)
ALK PHOS: 82 IU/L (ref 39–117)
ALT: 23 IU/L (ref 0–44)
AST: 18 IU/L (ref 0–40)
BUN / CREAT RATIO: 11 (ref 9–20)
BUN: 14 mg/dL (ref 6–24)
Bilirubin Total: 1.1 mg/dL (ref 0.0–1.2)
CALCIUM: 9.5 mg/dL (ref 8.7–10.2)
CO2: 26 mmol/L (ref 20–29)
CREATININE: 1.27 mg/dL (ref 0.76–1.27)
Chloride: 99 mmol/L (ref 96–106)
GFR calc Af Amer: 75 mL/min/{1.73_m2} (ref >59)
GFR, EST NON AFRICAN AMERICAN: 65 mL/min/{1.73_m2} (ref >59)
GLOBULIN, TOTAL: 2.4 g/dL (ref 1.5–4.5)
Glucose: 85 mg/dL (ref 65–99)
POTASSIUM: 4.3 mmol/L (ref 3.5–5.2)
Sodium: 138 mmol/L (ref 134–144)
Total Protein: 7.3 g/dL (ref 6.0–8.5)

## 2018-09-08 LAB — CBC WITH DIFFERENTIAL/PLATELET
BASOS: 1 %
Basophils Absolute: 0 10*3/uL (ref 0.0–0.2)
EOS (ABSOLUTE): 0.1 10*3/uL (ref 0.0–0.4)
EOS: 1 %
HEMATOCRIT: 41.6 % (ref 37.5–51.0)
HEMOGLOBIN: 14.2 g/dL (ref 13.0–17.7)
Immature Grans (Abs): 0 10*3/uL (ref 0.0–0.1)
Immature Granulocytes: 0 %
LYMPHS ABS: 2.2 10*3/uL (ref 0.7–3.1)
Lymphs: 48 %
MCH: 30.8 pg (ref 26.6–33.0)
MCHC: 34.1 g/dL (ref 31.5–35.7)
MCV: 90 fL (ref 79–97)
MONOCYTES: 8 %
MONOS ABS: 0.3 10*3/uL (ref 0.1–0.9)
NEUTROS ABS: 1.9 10*3/uL (ref 1.4–7.0)
Neutrophils: 42 %
Platelets: 288 10*3/uL (ref 150–450)
RBC: 4.61 x10E6/uL (ref 4.14–5.80)
RDW: 13 % (ref 12.3–15.4)
WBC: 4.4 10*3/uL (ref 3.4–10.8)

## 2018-09-08 LAB — LIPID PANEL
CHOL/HDL RATIO: 3.9 ratio (ref 0.0–5.0)
Cholesterol, Total: 158 mg/dL (ref 100–199)
HDL: 41 mg/dL (ref >39)
LDL CALC: 89 mg/dL (ref 0–99)
Triglycerides: 140 mg/dL (ref 0–149)
VLDL Cholesterol Cal: 28 mg/dL (ref 5–40)

## 2018-09-08 MED ORDER — OMEPRAZOLE 40 MG PO CPDR: DELAYED_RELEASE_CAPSULE | ORAL | 3 refills | Status: DC

## 2018-09-08 MED ORDER — ROSUVASTATIN CALCIUM 10 MG PO TABS: ORAL_TABLET | ORAL | 3 refills | Status: DC

## 2018-09-08 MED ORDER — AMLODIPINE BESYLATE 5 MG PO TABS: ORAL_TABLET | ORAL | 3 refills | Status: DC

## 2018-09-08 NOTE — Progress Notes (Signed)
   Subjective:    Patient ID: Samuel Richards, male    DOB: [birth year], 52 y.o.   MRN: [id number]  HPI He is here for complete examination.  He is taking amlodipine without difficulty.  He does intermittently use Prilosec for his reflux symptoms.  Continues on Crestor and is having no difficulty with that.  He is exercising regularly.  His marriage and home life are going quite well.  He does have questions concerning erectile issues however further discussion indicates that he can get and maintain an erection as well as ejaculate without difficulty.  Family and social history as well as health maintenance and immunizations was reviewed.   Review of Systems  All other systems reviewed and are negative.      Objective:   Physical Exam BP 112/80 (BP Location: Left Arm, Patient Position: Sitting)   Pulse 84   Temp 98.4 F (36.9 C)   Ht 5\' 9"  (6.767 m)   Wt 217 lb 12.8 oz (98.8 kg)   SpO2 96%   BMI 32.16 kg/m   General Appearance:    Alert, cooperative, no distress, appears stated age  Head:    Normocephalic, without obvious abnormality, atraumatic  Eyes:    PERRL, conjunctiva/corneas clear, EOM's intact, fundi    benign  Ears:    Normal TM's and external ear canals  Nose:   Nares normal, mucosa normal, no drainage or sinus   tenderness  Throat:   Lips, mucosa, and tongue normal; teeth and gums normal  Neck:   Supple, no lymphadenopathy;  thyroid:  no   enlargement/tenderness/nodules; no carotid   bruit or JVD     Lungs:     Clear to auscultation bilaterally without wheezes, rales or     ronchi; respirations unlabored      Heart:    Regular rate and rhythm, S1 and S2 normal, no murmur, rub   or gallop     Abdomen:     Soft, non-tender, nondistended, normoactive bowel sounds,    no masses, no hepatosplenomegaly  Genitalia:   Deferred  Rectal:   Deferred  Extremities:   No clubbing, cyanosis or edema  Pulses:   2+ and symmetric all extremities  Skin:   Skin color, texture,  turgor normal, no rashes or lesions  Lymph nodes:   Cervical, supraclavicular, and axillary nodes normal  Neurologic:   CNII-XII intact, normal strength, sensation and gait; reflexes 2+ and symmetric throughout          Psych:   Normal mood, affect, hygiene and grooming.           Assessment & Plan:  Routine general medical examination at a health care facility - Plan: CBC with Differential/Platelet, Comprehensive metabolic panel, Lipid panel  Essential hypertension, benign - Plan: amLODipine (NORVASC) 5 MG tablet  Gastroesophageal reflux disease without esophagitis - Plan: omeprazole (PRILOSEC) 40 MG capsule  Hyperlipidemia LDL goal <130 - Plan: rosuvastatin (CRESTOR) 10 MG tablet  Allergic rhinitis, unspecified seasonality, unspecified trigger I discussed erectile dysfunction with him and explained that at this point there is no evidence of dysfunction for him.  We will keep me informed if he runs any difficulty later.  He is to continue on his present medication regimen.  Encouraged him to continue with his exercise program.

## 2018-10-12 ENCOUNTER — Encounter: Payer: Self-pay | Admitting: Family Medicine

## 2018-10-13 ENCOUNTER — Encounter: Payer: Self-pay | Admitting: Family Medicine

## 2018-10-13 DIAGNOSIS — E785 Hyperlipidemia, unspecified: Secondary | ICD-10-CM

## 2018-10-15 MED ORDER — SILDENAFIL CITRATE 100 MG PO TABS: 50.0000 mg | ORAL_TABLET | Freq: Every day | ORAL | 11 refills | Status: DC | PRN

## 2018-11-29 ENCOUNTER — Encounter: Payer: Self-pay | Admitting: Family Medicine

## 2019-01-07 ENCOUNTER — Encounter: Payer: Self-pay | Admitting: Family Medicine

## 2019-09-14 ENCOUNTER — Other Ambulatory Visit: Payer: Self-pay

## 2019-09-14 ENCOUNTER — Ambulatory Visit: Payer: BC Managed Care – PPO | Admitting: Family Medicine

## 2019-09-14 ENCOUNTER — Encounter: Payer: Self-pay | Admitting: Family Medicine

## 2019-09-14 VITALS — BP 138/90 | HR 90 | Temp 97.8°F | Ht 68.5 in | Wt 228.6 lb

## 2019-09-14 DIAGNOSIS — K219 Gastro-esophageal reflux disease without esophagitis: Secondary | ICD-10-CM | POA: Diagnosis not present

## 2019-09-14 DIAGNOSIS — E785 Hyperlipidemia, unspecified: Secondary | ICD-10-CM

## 2019-09-14 DIAGNOSIS — Z23 Encounter for immunization: Secondary | ICD-10-CM

## 2019-09-14 DIAGNOSIS — Z0000 Encounter for general adult medical examination without abnormal findings: Secondary | ICD-10-CM

## 2019-09-14 DIAGNOSIS — J309 Allergic rhinitis, unspecified: Secondary | ICD-10-CM | POA: Diagnosis not present

## 2019-09-14 DIAGNOSIS — Z833 Family history of diabetes mellitus: Secondary | ICD-10-CM

## 2019-09-14 DIAGNOSIS — I10 Essential (primary) hypertension: Secondary | ICD-10-CM | POA: Diagnosis not present

## 2019-09-14 MED ORDER — ROSUVASTATIN CALCIUM 10 MG PO TABS: ORAL_TABLET | ORAL | 3 refills | Status: DC

## 2019-09-14 MED ORDER — AMLODIPINE BESYLATE 5 MG PO TABS: ORAL_TABLET | ORAL | 3 refills | Status: DC

## 2019-09-14 NOTE — Progress Notes (Signed)
   Subjective:    Patient ID: Samuel Richards, male    DOB: [birth year], 53 y.o.   MRN: [id number]  HPI He is here for complete examination.  He did run out of his medications 10 days ago.  He is supposed to be on Crestor as well as amlodipine.  He does have a history of reflux disease and uses a PPI on an as-needed basis.  His allergies are under good control.  He does have a family history of diabetes.  His work and home life are going well.  He exercises regularly.  He has no particular concerns or complaints. Family and social history as well as health maintenance and immunizations was reviewed Review of Systems  All other systems reviewed and are negative.      Objective:   Physical Exam Alert and in no distress. Tympanic membranes and canals are normal. Pharyngeal area is normal. Neck is supple without adenopathy or thyromegaly. Cardiac exam shows a regular sinus rhythm without murmurs or gallops. Lungs are clear to auscultation. Abdominal exam shows normal bowel sounds with no hepatosplenomegaly.      Assessment & Plan:  Routine general medical examination at a health care facility - Plan: CBC with Differential, Comprehensive metabolic panel, Lipid panel  Hyperlipidemia LDL goal <130 - Plan: rosuvastatin (CRESTOR) 10 MG tablet  Essential hypertension, benign - Plan: CBC with Differential, Comprehensive metabolic panel, amLODipine (NORVASC) 5 MG tablet  Allergic rhinitis, unspecified seasonality, unspecified trigger  Gastroesophageal reflux disease without esophagitis  Family history of diabetes mellitus - Plan: Comprehensive metabolic panel  Need for Tdap vaccination - Plan: Tdap vaccine greater than or equal to 7yo IM His medications were renewed.  He is to return here with his blood pressure cuff to compare against ours.  Discussed proper technique for checking blood pressure.  Might need to readjust his medication based on this.

## 2019-09-15 ENCOUNTER — Telehealth: Payer: Self-pay

## 2019-09-15 LAB — CBC WITH DIFFERENTIAL/PLATELET
Basophils Absolute: 0 10*3/uL (ref 0.0–0.2)
Basos: 1 %
EOS (ABSOLUTE): 0 10*3/uL (ref 0.0–0.4)
Eos: 1 %
Hematocrit: 42.5 % (ref 37.5–51.0)
Hemoglobin: 14.8 g/dL (ref 13.0–17.7)
Immature Grans (Abs): 0 10*3/uL (ref 0.0–0.1)
Immature Granulocytes: 0 %
Lymphocytes Absolute: 1.9 10*3/uL (ref 0.7–3.1)
Lymphs: 45 %
MCH: 31.5 pg (ref 26.6–33.0)
MCHC: 34.8 g/dL (ref 31.5–35.7)
MCV: 90 fL (ref 79–97)
Monocytes Absolute: 0.3 10*3/uL (ref 0.1–0.9)
Monocytes: 7 %
Neutrophils Absolute: 2 10*3/uL (ref 1.4–7.0)
Neutrophils: 46 %
Platelets: 259 10*3/uL (ref 150–450)
RBC: 4.7 x10E6/uL (ref 4.14–5.80)
RDW: 13.1 % (ref 11.6–15.4)
WBC: 4.3 10*3/uL (ref 3.4–10.8)

## 2019-09-15 LAB — COMPREHENSIVE METABOLIC PANEL
ALT: 28 IU/L (ref 0–44)
AST: 22 IU/L (ref 0–40)
Albumin/Globulin Ratio: 1.6 (ref 1.2–2.2)
Albumin: 4.6 g/dL (ref 3.8–4.9)
Alkaline Phosphatase: 99 IU/L (ref 39–117)
BUN/Creatinine Ratio: 11 (ref 9–20)
BUN: 14 mg/dL (ref 6–24)
Bilirubin Total: 0.9 mg/dL (ref 0.0–1.2)
CO2: 23 mmol/L (ref 20–29)
Calcium: 9.4 mg/dL (ref 8.7–10.2)
Chloride: 99 mmol/L (ref 96–106)
Creatinine, Ser: 1.32 mg/dL — ABNORMAL HIGH (ref 0.76–1.27)
GFR calc Af Amer: 71 mL/min/{1.73_m2} (ref >59)
GFR calc non Af Amer: 61 mL/min/{1.73_m2} (ref >59)
Globulin, Total: 2.8 g/dL (ref 1.5–4.5)
Glucose: 84 mg/dL (ref 65–99)
Potassium: 4.2 mmol/L (ref 3.5–5.2)
Sodium: 140 mmol/L (ref 134–144)
Total Protein: 7.4 g/dL (ref 6.0–8.5)

## 2019-09-15 LAB — LIPID PANEL
Chol/HDL Ratio: 5.3 ratio — ABNORMAL HIGH (ref 0.0–5.0)
Cholesterol, Total: 217 mg/dL — ABNORMAL HIGH (ref 100–199)
HDL: 41 mg/dL (ref >39)
LDL Chol Calc (NIH): 138 mg/dL — ABNORMAL HIGH (ref 0–99)
Triglycerides: 209 mg/dL — ABNORMAL HIGH (ref 0–149)
VLDL Cholesterol Cal: 38 mg/dL (ref 5–40)

## 2019-09-15 NOTE — Telephone Encounter (Signed)
Called pt to get info to complete form. LVM for him to call back. [place]

## 2020-01-14 ENCOUNTER — Encounter: Payer: Self-pay | Admitting: Family Medicine

## 2020-08-25 ENCOUNTER — Encounter: Payer: Self-pay | Admitting: Family Medicine

## 2020-08-31 MED ORDER — SILDENAFIL CITRATE 50 MG PO TABS: 50.0000 mg | ORAL_TABLET | Freq: Every day | ORAL | 5 refills | Status: DC | PRN

## 2020-09-15 ENCOUNTER — Encounter: Payer: Self-pay | Admitting: Family Medicine

## 2020-09-15 ENCOUNTER — Other Ambulatory Visit: Payer: Self-pay

## 2020-09-15 ENCOUNTER — Ambulatory Visit: Payer: BC Managed Care – PPO | Admitting: Family Medicine

## 2020-09-15 VITALS — BP 132/86 | HR 92 | Temp 97.7°F | Ht 68.0 in | Wt 227.6 lb

## 2020-09-15 DIAGNOSIS — E785 Hyperlipidemia, unspecified: Secondary | ICD-10-CM | POA: Diagnosis not present

## 2020-09-15 DIAGNOSIS — I10 Essential (primary) hypertension: Secondary | ICD-10-CM

## 2020-09-15 DIAGNOSIS — Z0000 Encounter for general adult medical examination without abnormal findings: Secondary | ICD-10-CM

## 2020-09-15 DIAGNOSIS — J309 Allergic rhinitis, unspecified: Secondary | ICD-10-CM | POA: Diagnosis not present

## 2020-09-15 DIAGNOSIS — Z833 Family history of diabetes mellitus: Secondary | ICD-10-CM | POA: Diagnosis not present

## 2020-09-15 DIAGNOSIS — N529 Male erectile dysfunction, unspecified: Secondary | ICD-10-CM

## 2020-09-15 DIAGNOSIS — Z1159 Encounter for screening for other viral diseases: Secondary | ICD-10-CM

## 2020-09-15 MED ORDER — ROSUVASTATIN CALCIUM 10 MG PO TABS: ORAL_TABLET | ORAL | 3 refills | Status: DC

## 2020-09-15 MED ORDER — AMLODIPINE BESYLATE 5 MG PO TABS: ORAL_TABLET | ORAL | 3 refills | Status: DC

## 2020-09-15 NOTE — Progress Notes (Signed)
   Subjective:    Patient ID: Samuel Richards, male    DOB: 10/26/1965, 54 y.o.   MRN: [id number]  HPI He is here for complete examination.  He continues on amlodipine without difficulty.  He is also taking Crestor without aches or pains.  He has found the need for Viagra to help with his ED.  He does not smoke or drink and is not on any medications that would cause problems.  He is using it intermittently.  He does occasionally have difficulty with reflux but usually revolves around green peppers.  His allergies seem to be under good control.  Family history significant for diabetes.  Otherwise his family and social history as well as health maintenance and immunizations was reviewed.  He has started an exercise program.  His work is going well.  Home life is going well.   Review of Systems  All other systems reviewed and are negative.      Objective:   Physical Exam Alert and in no distress. Tympanic membranes and canals are normal. Pharyngeal area is normal. Neck is supple without adenopathy or thyromegaly. Cardiac exam shows a regular sinus rhythm without murmurs or gallops. Lungs are clear to auscultation. Abdominal exam shows no masses or tenderness with normal bowel sounds       Assessment & Plan:  Routine general medical examination at a health care facility - Plan: Comprehensive metabolic panel, CBC with Differential/Platelet, Lipid panel  Essential hypertension, benign - Plan: Comprehensive metabolic panel, CBC with Differential/Platelet, amLODipine (NORVASC) 5 MG tablet  Hyperlipidemia LDL goal <130 - Plan: Lipid panel, rosuvastatin (CRESTOR) 10 MG tablet  Allergic rhinitis, unspecified seasonality, unspecified trigger  Family history of diabetes mellitus  Erectile dysfunction, unspecified erectile dysfunction type  Encounter for hepatitis C screening test for low risk patient - Plan: Hepatitis C antibody He will continue his present medication regimen and encouraged  him to continue with his exercise.  No therapy needed for his allergies.  Continue to monitor for diabetes.  He will call when he needs a refill on his Viagra.

## 2020-09-16 ENCOUNTER — Encounter: Payer: Self-pay | Admitting: Family Medicine

## 2020-09-16 LAB — CBC WITH DIFFERENTIAL/PLATELET
Basophils Absolute: 0 10*3/uL (ref 0.0–0.2)
Basos: 1 %
EOS (ABSOLUTE): 0 10*3/uL (ref 0.0–0.4)
Eos: 1 %
Hematocrit: 42.5 % (ref 37.5–51.0)
Hemoglobin: 14.4 g/dL (ref 13.0–17.7)
Immature Grans (Abs): 0 10*3/uL (ref 0.0–0.1)
Immature Granulocytes: 0 %
Lymphocytes Absolute: 2 10*3/uL (ref 0.7–3.1)
Lymphs: 46 %
MCH: 30.2 pg (ref 26.6–33.0)
MCHC: 33.9 g/dL (ref 31.5–35.7)
MCV: 89 fL (ref 79–97)
Monocytes Absolute: 0.3 10*3/uL (ref 0.1–0.9)
Monocytes: 8 %
Neutrophils Absolute: 1.9 10*3/uL (ref 1.4–7.0)
Neutrophils: 44 %
Platelets: 280 10*3/uL (ref 150–450)
RBC: 4.77 x10E6/uL (ref 4.14–5.80)
RDW: 12.7 % (ref 11.6–15.4)
WBC: 4.3 10*3/uL (ref 3.4–10.8)

## 2020-09-16 LAB — COMPREHENSIVE METABOLIC PANEL
ALT: 35 IU/L (ref 0–44)
AST: 21 IU/L (ref 0–40)
Albumin/Globulin Ratio: 1.8 (ref 1.2–2.2)
Albumin: 4.9 g/dL (ref 3.8–4.9)
Alkaline Phosphatase: 92 IU/L (ref 44–121)
BUN/Creatinine Ratio: 12 (ref 9–20)
BUN: 15 mg/dL (ref 6–24)
Bilirubin Total: 1 mg/dL (ref 0.0–1.2)
CO2: 23 mmol/L (ref 20–29)
Calcium: 9.6 mg/dL (ref 8.7–10.2)
Chloride: 103 mmol/L (ref 96–106)
Creatinine, Ser: 1.28 mg/dL — ABNORMAL HIGH (ref 0.76–1.27)
GFR calc Af Amer: 73 mL/min/{1.73_m2} (ref >59)
GFR calc non Af Amer: 63 mL/min/{1.73_m2} (ref >59)
Globulin, Total: 2.7 g/dL (ref 1.5–4.5)
Glucose: 83 mg/dL (ref 65–99)
Potassium: 4.8 mmol/L (ref 3.5–5.2)
Sodium: 145 mmol/L — ABNORMAL HIGH (ref 134–144)
Total Protein: 7.6 g/dL (ref 6.0–8.5)

## 2020-09-16 LAB — HEPATITIS C ANTIBODY: Hep C Virus Ab: <0.1 s/co ratio (ref 0.0–0.9)

## 2020-09-16 LAB — LIPID PANEL
Chol/HDL Ratio: 3.8 ratio (ref 0.0–5.0)
Cholesterol, Total: 159 mg/dL (ref 100–199)
HDL: 42 mg/dL (ref >39)
LDL Chol Calc (NIH): 92 mg/dL (ref 0–99)
Triglycerides: 143 mg/dL (ref 0–149)
VLDL Cholesterol Cal: 25 mg/dL (ref 5–40)

## 2021-09-18 ENCOUNTER — Other Ambulatory Visit: Payer: Self-pay

## 2021-09-18 ENCOUNTER — Encounter: Payer: Self-pay | Admitting: Family Medicine

## 2021-09-18 ENCOUNTER — Ambulatory Visit: Payer: BC Managed Care – PPO | Admitting: Family Medicine

## 2021-09-18 VITALS — BP 136/88 | HR 78 | Temp 97.5°F | Ht 68.5 in | Wt 219.4 lb

## 2021-09-18 DIAGNOSIS — K219 Gastro-esophageal reflux disease without esophagitis: Secondary | ICD-10-CM

## 2021-09-18 DIAGNOSIS — Z833 Family history of diabetes mellitus: Secondary | ICD-10-CM

## 2021-09-18 DIAGNOSIS — E785 Hyperlipidemia, unspecified: Secondary | ICD-10-CM

## 2021-09-18 DIAGNOSIS — I10 Essential (primary) hypertension: Secondary | ICD-10-CM | POA: Diagnosis not present

## 2021-09-18 DIAGNOSIS — Z125 Encounter for screening for malignant neoplasm of prostate: Secondary | ICD-10-CM

## 2021-09-18 DIAGNOSIS — Z0000 Encounter for general adult medical examination without abnormal findings: Secondary | ICD-10-CM | POA: Diagnosis not present

## 2021-09-18 DIAGNOSIS — J309 Allergic rhinitis, unspecified: Secondary | ICD-10-CM | POA: Diagnosis not present

## 2021-09-18 DIAGNOSIS — Z7185 Encounter for immunization safety counseling: Secondary | ICD-10-CM

## 2021-09-18 MED ORDER — LOSARTAN POTASSIUM 50 MG PO TABS: 50.0000 mg | ORAL_TABLET | Freq: Every day | ORAL | 3 refills | Status: DC

## 2021-09-18 NOTE — Progress Notes (Signed)
   Subjective:    Patient ID: Samuel Richards, male    DOB: [birth year], 55 y.o.   MRN: [id number]  HPI He is here for complete examination.  He does have underlying hypertension and presently is on amlodipine.  Continues on Crestor and having no difficulty with that.  He does occasionally use sildenafil for E his allergies seem to be under good control.  He does have reflux disease but presently is on no medications.  He has a family history of diabetes.  He also has concerns about prostate cancer.  His home and work life are going well.  He does exercise fairly regularly.  Otherwise family and social history as well as health maintenance and immunizations was reviewed   Review of Systems  All other systems reviewed and are negative.     Objective:   Physical Exam Alert and in no distress. Tympanic membranes and canals are normal. Pharyngeal area is normal. Neck is supple without adenopathy or thyromegaly. Cardiac exam shows a regular sinus rhythm without murmurs or gallops. Lungs are clear to auscultation.  Abdominal exam shows no masses or tenderness with normal bowel sounds        Assessment & Plan:   Routine general medical examination at a health care facility - Plan: CBC with Differential/Platelet, Comprehensive metabolic panel, Lipid panel  Family history of diabetes mellitus - Plan: CBC with Differential/Platelet, Comprehensive metabolic panel  Allergic rhinitis, unspecified seasonality, unspecified trigger  Gastroesophageal reflux disease without esophagitis  Essential hypertension, benign - Plan: CBC with Differential/Platelet, Comprehensive metabolic panel  Hyperlipidemia LDL goal <130 - Plan: Lipid panel  Screening for prostate cancer - Plan: PSA  Vaccine counseling His blood pressure is not adequately controlled.  Encouraged him to continue with his diet and exercise as well as amlodipine.  Losartan added to his regimen. Discussed prostate cancer and screening for  this. Continue to treat the allergies and reflux as needed. Immunization recommendations was given.

## 2021-09-19 LAB — LIPID PANEL
Chol/HDL Ratio: 3.4 ratio (ref 0.0–5.0)
Cholesterol, Total: 136 mg/dL (ref 100–199)
HDL: 40 mg/dL (ref >39)
LDL Chol Calc (NIH): 66 mg/dL (ref 0–99)
Triglycerides: 178 mg/dL — ABNORMAL HIGH (ref 0–149)
VLDL Cholesterol Cal: 30 mg/dL (ref 5–40)

## 2021-09-19 LAB — CBC WITH DIFFERENTIAL/PLATELET
Basophils Absolute: 0 10*3/uL (ref 0.0–0.2)
Basos: 1 %
EOS (ABSOLUTE): 0 10*3/uL (ref 0.0–0.4)
Eos: 1 %
Hematocrit: 43.4 % (ref 37.5–51.0)
Hemoglobin: 14.6 g/dL (ref 13.0–17.7)
Immature Grans (Abs): 0 10*3/uL (ref 0.0–0.1)
Immature Granulocytes: 0 %
Lymphocytes Absolute: 2.2 10*3/uL (ref 0.7–3.1)
Lymphs: 47 %
MCH: 30.4 pg (ref 26.6–33.0)
MCHC: 33.6 g/dL (ref 31.5–35.7)
MCV: 90 fL (ref 79–97)
Monocytes Absolute: 0.3 10*3/uL (ref 0.1–0.9)
Monocytes: 6 %
Neutrophils Absolute: 2.1 10*3/uL (ref 1.4–7.0)
Neutrophils: 45 %
Platelets: 290 10*3/uL (ref 150–450)
RBC: 4.8 x10E6/uL (ref 4.14–5.80)
RDW: 12.9 % (ref 11.6–15.4)
WBC: 4.7 10*3/uL (ref 3.4–10.8)

## 2021-09-19 LAB — PSA: Prostate Specific Ag, Serum: 0.8 ng/mL (ref 0.0–4.0)

## 2021-09-19 LAB — COMPREHENSIVE METABOLIC PANEL
ALT: 28 IU/L (ref 0–44)
AST: 21 IU/L (ref 0–40)
Albumin/Globulin Ratio: 2 (ref 1.2–2.2)
Albumin: 4.9 g/dL (ref 3.8–4.9)
Alkaline Phosphatase: 88 IU/L (ref 44–121)
BUN/Creatinine Ratio: 9 (ref 9–20)
BUN: 11 mg/dL (ref 6–24)
Bilirubin Total: 1.4 mg/dL — ABNORMAL HIGH (ref 0.0–1.2)
CO2: 27 mmol/L (ref 20–29)
Calcium: 9.7 mg/dL (ref 8.7–10.2)
Chloride: 99 mmol/L (ref 96–106)
Creatinine, Ser: 1.17 mg/dL (ref 0.76–1.27)
Globulin, Total: 2.5 g/dL (ref 1.5–4.5)
Glucose: 84 mg/dL (ref 70–99)
Potassium: 4.4 mmol/L (ref 3.5–5.2)
Sodium: 140 mmol/L (ref 134–144)
Total Protein: 7.4 g/dL (ref 6.0–8.5)
eGFR: 74 mL/min/{1.73_m2} (ref >59)

## 2021-09-20 ENCOUNTER — Encounter: Payer: Self-pay | Admitting: Family Medicine

## 2021-10-08 ENCOUNTER — Other Ambulatory Visit: Payer: Self-pay | Admitting: Family Medicine

## 2021-10-08 DIAGNOSIS — E785 Hyperlipidemia, unspecified: Secondary | ICD-10-CM

## 2021-10-11 ENCOUNTER — Other Ambulatory Visit: Payer: Self-pay | Admitting: Family Medicine

## 2021-10-11 DIAGNOSIS — I10 Essential (primary) hypertension: Secondary | ICD-10-CM

## 2021-10-16 ENCOUNTER — Ambulatory Visit: Payer: BC Managed Care – PPO | Admitting: Family Medicine

## 2021-10-16 ENCOUNTER — Other Ambulatory Visit: Payer: Self-pay

## 2021-10-16 ENCOUNTER — Encounter: Payer: Self-pay | Admitting: Family Medicine

## 2021-10-16 VITALS — BP 128/78 | HR 84 | Temp 97.0°F | Wt 224.4 lb

## 2021-10-16 DIAGNOSIS — I10 Essential (primary) hypertension: Secondary | ICD-10-CM | POA: Diagnosis not present

## 2021-10-16 NOTE — Progress Notes (Signed)
° °  Subjective:    Patient ID: Samuel Richards, male    DOB: Dec 18, 1965, 56 y.o.   MRN: [id number]  HPI He is here for recheck of his blood pressure.  He does have a blood pressure cuff at home but did not bring it with him.   Review of Systems     Objective:   Physical Exam Alert and in no distress.  Blood pressure is recorded.       Assessment & Plan:  Essential hypertension, benign He will continue on his present medication regimen.  Recheck here in approximately 1 year.

## 2021-10-23 ENCOUNTER — Encounter: Payer: Self-pay | Admitting: Family Medicine

## 2021-10-25 ENCOUNTER — Encounter: Payer: Self-pay | Admitting: Family Medicine

## 2022-01-10 ENCOUNTER — Encounter: Payer: Self-pay | Admitting: Family Medicine

## 2022-01-11 MED ORDER — SILDENAFIL CITRATE 20 MG PO TABS: 20.0000 mg | ORAL_TABLET | ORAL | 5 refills | Status: DC | PRN

## 2022-01-12 ENCOUNTER — Telehealth: Payer: Self-pay

## 2022-01-12 NOTE — Telephone Encounter (Signed)
P.A. SILDENAFIL  

## 2022-01-15 ENCOUNTER — Other Ambulatory Visit: Payer: Self-pay | Admitting: Family Medicine

## 2022-01-15 DIAGNOSIS — E785 Hyperlipidemia, unspecified: Secondary | ICD-10-CM

## 2022-01-22 NOTE — Telephone Encounter (Signed)
P.A. Sildenafil denied, only covered for PAH or [place].

## 2022-01-28 ENCOUNTER — Encounter: Payer: Self-pay | Admitting: Family Medicine

## 2022-02-19 ENCOUNTER — Encounter: Payer: Self-pay | Admitting: Family Medicine

## 2022-02-19 MED ORDER — SILDENAFIL CITRATE 20 MG PO TABS: 20.0000 mg | ORAL_TABLET | ORAL | 5 refills | Status: DC | PRN

## 2022-03-12 NOTE — Telephone Encounter (Signed)
Left message for pt regarding Good Rx

## 2022-06-19 ENCOUNTER — Encounter: Payer: Self-pay | Admitting: Internal Medicine

## 2022-08-05 ENCOUNTER — Encounter: Payer: Self-pay | Admitting: Internal Medicine

## 2022-10-08 ENCOUNTER — Ambulatory Visit: Payer: BC Managed Care – PPO | Admitting: Family Medicine

## 2022-10-08 ENCOUNTER — Encounter: Payer: Self-pay | Admitting: Family Medicine

## 2022-10-08 VITALS — BP 140/94 | HR 77 | Ht 68.5 in | Wt 222.0 lb

## 2022-10-08 DIAGNOSIS — Z125 Encounter for screening for malignant neoplasm of prostate: Secondary | ICD-10-CM | POA: Diagnosis not present

## 2022-10-08 DIAGNOSIS — K219 Gastro-esophageal reflux disease without esophagitis: Secondary | ICD-10-CM

## 2022-10-08 DIAGNOSIS — Z0000 Encounter for general adult medical examination without abnormal findings: Secondary | ICD-10-CM

## 2022-10-08 DIAGNOSIS — J309 Allergic rhinitis, unspecified: Secondary | ICD-10-CM

## 2022-10-08 DIAGNOSIS — Z23 Encounter for immunization: Secondary | ICD-10-CM | POA: Diagnosis not present

## 2022-10-08 DIAGNOSIS — E785 Hyperlipidemia, unspecified: Secondary | ICD-10-CM | POA: Diagnosis not present

## 2022-10-08 DIAGNOSIS — I10 Essential (primary) hypertension: Secondary | ICD-10-CM | POA: Diagnosis not present

## 2022-10-08 DIAGNOSIS — D126 Benign neoplasm of colon, unspecified: Secondary | ICD-10-CM | POA: Insufficient documentation

## 2022-10-08 DIAGNOSIS — Z833 Family history of diabetes mellitus: Secondary | ICD-10-CM

## 2022-10-08 DIAGNOSIS — Z7185 Encounter for immunization safety counseling: Secondary | ICD-10-CM

## 2022-10-08 LAB — POCT URINALYSIS DIP (PROADVANTAGE DEVICE)
Bilirubin, UA: NEGATIVE
Glucose, UA: NEGATIVE mg/dL
Ketones, POC UA: NEGATIVE mg/dL
Leukocytes, UA: NEGATIVE
Nitrite, UA: NEGATIVE
Protein Ur, POC: NEGATIVE mg/dL
Specific Gravity, Urine: 1.02
Urobilinogen, Ur: 0.2
pH, UA: 6 (ref 5.0–8.0)

## 2022-10-08 MED ORDER — ROSUVASTATIN CALCIUM 10 MG PO TABS: 10.0000 mg | ORAL_TABLET | Freq: Every day | ORAL | 3 refills | Status: DC

## 2022-10-08 MED ORDER — AMLODIPINE BESYLATE 10 MG PO TABS: 10.0000 mg | ORAL_TABLET | Freq: Every day | ORAL | 3 refills | Status: DC

## 2022-10-08 NOTE — Progress Notes (Signed)
Complete physical exam  Patient: Samuel Richards   DOB: Oct 31, 1965   56 y.o. Male  MRN: [id number]  Subjective:    Chief Complaint  Patient presents with   Annual Exam    Fasting. Has wellness form that need to be completed for his insurance.     Samuel Richards is a 56 y.o. male who presents today for a complete physical exam. He reports consuming a general diet. The patient does not participate in regular exercise at present. He generally feels fairly well. He reports sleeping fairly well. He doubled up on the dosing of amlodipine to 10 mg and stopped taking the losartan.  He has been checking his blood pressure at home however has been taking it inappropriately so the numbers not accurate.  He continues on Crestor and is having no difficulty with that.  Also uses sildenafil on an as-needed basis.  His allergies and reflux are under good control with OTC medications.  He had a colonoscopy in 2019 and is scheduled for repeat in 2025.  He does complain of intermittent right knee pain and swelling and has concerns about gout.   Most recent fall risk assessment:    10/08/2022    9:27 AM  [place] in the past year? 0  Number falls in past yr: 0  Injury with Fall? 0  Risk for fall due to : No Fall Risks  Follow up Falls evaluation completed     Most recent depression screenings:    10/08/2022    9:26 AM 09/18/2021    8:22 AM  PHQ 2/9 Scores  PHQ - 2 Score 0 0    Vision:Within last year and Dental: No current dental problems and Receives regular dental care    Patient Care Team: Denita Lung, MD as PCP - General (Family Medicine)   Outpatient Medications Prior to Visit  Medication Sig Note   Carboxymethylcellulose Sodium (REFRESH LIQUIGEL) 1 % GEL 1 drop as needed.    Multiple Vitamins-Minerals (MULTIVITAMIN ADULT EXTRA C PO)     Naproxen Sodium 220 MG CAPS     sildenafil (REVATIO) 20 MG tablet Take 1 tablet (20 mg total) by mouth as needed.     [DISCONTINUED] amLODipine (NORVASC) 5 MG tablet TAKE 1 TABLET BY MOUTH EVERY DAY    [DISCONTINUED] rosuvastatin (CRESTOR) 10 MG tablet TAKE 1 TABLET BY MOUTH EVERY DAY    losartan (COZAAR) 50 MG tablet Take 1 tablet (50 mg total) by mouth daily. (Patient not taking: Reported on 10/08/2022) 10/08/2022: Stopped taking due to side effects.    No facility-administered medications prior to visit.    Review of Systems  All other systems reviewed and are negative.         Objective:       Physical Exam  Alert and in no distress. Tympanic membranes and canals are normal. Pharyngeal area is normal. Neck is supple without adenopathy or thyromegaly. Cardiac exam shows a regular sinus rhythm without murmurs or gallops. Lungs are clear to auscultation.  Abdominal exam shows no masses or tenderness with normal bowel sounds. Dipstick was positive however the microscopic was negative for blood.     Assessment & Plan:   Routine general medical examination at a health care facility - Plan: CBC with Differential/Platelet, Comprehensive metabolic panel, Lipid panel  ESSENTIAL HYPERTENSION, BENIGN - Plan: CBC with Differential/Platelet, Comprehensive metabolic panel, amLODipine (NORVASC) 10 MG tablet  Allergic rhinitis, unspecified seasonality, unspecified trigger  Gastroesophageal reflux disease  without esophagitis  Hyperlipidemia LDL goal <130 - Plan: Lipid panel, rosuvastatin (CRESTOR) 10 MG tablet  Family history of diabetes mellitus  Vaccine counseling  Need for influenza vaccination - Plan: Flu Vaccine QUAD 6+ mos PF IM (Fluarix Quad PF)  Screening for prostate cancer - Plan: PSA  Need for COVID-19 vaccine - Plan: Pfizer Fall 2023 Covid-19 Vaccine 26yr and older  Adenomatous polyp of colon, unspecified part of colon   Immunization History  Administered Date(s) Administered   DT (Pediatric) 10/15/2003   Influenza Split 07/12/2013   Influenza,inj,Quad PF,6+ Mos 07/09/2016    Influenza-Unspecified 08/04/2017, 08/15/2018, 07/03/2019, 08/28/2020, 08/07/2021   PFIZER(Purple Top)SARS-COV-2 Vaccination 12/27/2019, 01/18/2020, 09/16/2020   Tdap 03/09/2009, 09/14/2019    Health Maintenance  Topic Date Due   HIV Screening  Never done   INFLUENZA VACCINE  05/14/2022   Zoster Vaccines- Shingrix (1 of 2) 01/22/2026 (Originally 01/23/2016)   COLONOSCOPY (Pts 45-437yrInsurance coverage will need to be confirmed)  12/25/2023   DTaP/Tdap/Td (4 - Td or Tdap) 09/13/2029   Hepatitis C Screening  Completed   HPV VACCINES  Aged Out   COVID-19 Vaccine  Discontinued    Discussed health benefits of physical activity, and encouraged him to engage in regular exercise appropriate for his age and condition.  Problem List Items Addressed This Visit     Adenomatous polyp of colon   ESSENTIAL HYPERTENSION, BENIGN   Relevant Medications   rosuvastatin (CRESTOR) 10 MG tablet   amLODipine (NORVASC) 10 MG tablet   Other Relevant Orders   CBC with Differential/Platelet   Comprehensive metabolic panel   Family history of diabetes mellitus   GERD (gastroesophageal reflux disease)   Hyperlipidemia LDL goal <130   Relevant Medications   rosuvastatin (CRESTOR) 10 MG tablet   amLODipine (NORVASC) 10 MG tablet   Other Relevant Orders   Lipid panel   Rhinitis, allergic   Other Visit Diagnoses     Routine general medical examination at a health care facility    -  Primary   Relevant Orders   CBC with Differential/Platelet   Comprehensive metabolic panel   Lipid panel   Vaccine counseling       Need for influenza vaccination       Relevant Orders   Flu Vaccine QUAD 6+ mos PF IM (Fluarix Quad PF)   Screening for prostate cancer       Relevant Orders   PSA   Need for COVID-19 vaccine       Relevant Orders   Pfizer Fall 2023 Covid-19 Vaccine 12100yrnd older      To keep track of his blood pressure at home.  He was given proper instructions on how to do that.  If his pressure  is above 130/80, he will inform me of that and I will make adjustments in his medication and probably start him back on the losartan    Samuel Richards

## 2022-10-09 LAB — COMPREHENSIVE METABOLIC PANEL
ALT: 39 IU/L (ref 0–44)
AST: 23 IU/L (ref 0–40)
Albumin/Globulin Ratio: 2 (ref 1.2–2.2)
Albumin: 5.1 g/dL — ABNORMAL HIGH (ref 3.8–4.9)
Alkaline Phosphatase: 79 IU/L (ref 44–121)
BUN/Creatinine Ratio: 13 (ref 9–20)
BUN: 16 mg/dL (ref 6–24)
Bilirubin Total: 1.1 mg/dL (ref 0.0–1.2)
CO2: 22 mmol/L (ref 20–29)
Calcium: 10.1 mg/dL (ref 8.7–10.2)
Chloride: 100 mmol/L (ref 96–106)
Creatinine, Ser: 1.24 mg/dL (ref 0.76–1.27)
Globulin, Total: 2.6 g/dL (ref 1.5–4.5)
Glucose: 85 mg/dL (ref 70–99)
Potassium: 4.1 mmol/L (ref 3.5–5.2)
Sodium: 139 mmol/L (ref 134–144)
Total Protein: 7.7 g/dL (ref 6.0–8.5)
eGFR: 68 mL/min/{1.73_m2} (ref >59)

## 2022-10-09 LAB — CBC WITH DIFFERENTIAL/PLATELET
Basophils Absolute: 0 10*3/uL (ref 0.0–0.2)
Basos: 1 %
EOS (ABSOLUTE): 0 10*3/uL (ref 0.0–0.4)
Eos: 1 %
Hematocrit: 40.6 % (ref 37.5–51.0)
Hemoglobin: 14.2 g/dL (ref 13.0–17.7)
Immature Grans (Abs): 0 10*3/uL (ref 0.0–0.1)
Immature Granulocytes: 0 %
Lymphocytes Absolute: 2 10*3/uL (ref 0.7–3.1)
Lymphs: 45 %
MCH: 30.9 pg (ref 26.6–33.0)
MCHC: 35 g/dL (ref 31.5–35.7)
MCV: 88 fL (ref 79–97)
Monocytes Absolute: 0.3 10*3/uL (ref 0.1–0.9)
Monocytes: 7 %
Neutrophils Absolute: 2.1 10*3/uL (ref 1.4–7.0)
Neutrophils: 46 %
Platelets: 257 10*3/uL (ref 150–450)
RBC: 4.6 x10E6/uL (ref 4.14–5.80)
RDW: 11.9 % (ref 11.6–15.4)
WBC: 4.5 10*3/uL (ref 3.4–10.8)

## 2022-10-09 LAB — LIPID PANEL
Chol/HDL Ratio: 3 ratio (ref 0.0–5.0)
Cholesterol, Total: 129 mg/dL (ref 100–199)
HDL: 43 mg/dL (ref >39)
LDL Chol Calc (NIH): 63 mg/dL (ref 0–99)
Triglycerides: 128 mg/dL (ref 0–149)
VLDL Cholesterol Cal: 23 mg/dL (ref 5–40)

## 2022-10-09 LAB — PSA: Prostate Specific Ag, Serum: 0.9 ng/mL (ref 0.0–4.0)

## 2022-11-06 ENCOUNTER — Encounter: Payer: Self-pay | Admitting: Family Medicine

## 2022-11-18 ENCOUNTER — Encounter: Payer: Self-pay | Admitting: Family Medicine

## 2022-11-19 NOTE — Telephone Encounter (Signed)
Thanks I moved  CPE to 2025 & pt informed

## 2022-11-26 ENCOUNTER — Encounter: Payer: BC Managed Care – PPO | Admitting: Family Medicine

## 2023-03-20 ENCOUNTER — Encounter: Payer: Self-pay | Admitting: Family Medicine

## 2023-07-23 ENCOUNTER — Other Ambulatory Visit (INDEPENDENT_AMBULATORY_CARE_PROVIDER_SITE_OTHER): Payer: BC Managed Care – PPO

## 2023-07-23 DIAGNOSIS — Z23 Encounter for immunization: Secondary | ICD-10-CM

## 2023-09-05 ENCOUNTER — Telehealth: Payer: BC Managed Care – PPO | Admitting: Nurse Practitioner

## 2023-09-05 DIAGNOSIS — M545 Low back pain, unspecified: Secondary | ICD-10-CM

## 2023-09-05 MED ORDER — NAPROXEN 500 MG PO TABS: 500.0000 mg | ORAL_TABLET | Freq: Two times a day (BID) | ORAL | 0 refills | Status: AC

## 2023-09-05 MED ORDER — CYCLOBENZAPRINE HCL 10 MG PO TABS: 10.0000 mg | ORAL_TABLET | Freq: Three times a day (TID) | ORAL | 0 refills | Status: DC | PRN

## 2023-09-05 NOTE — Progress Notes (Signed)
E-Visit for Back Pain   We are sorry that you are not feeling well.  Here is how we plan to help!  Based on what you have shared with me it looks like you mostly have acute back pain.  Acute back pain is defined as musculoskeletal pain that can resolve in 1-3 weeks with conservative treatment.  I have prescribed Naprosyn 500 mg take one by mouth twice a day non-steroid anti-inflammatory (NSAID) as well as Flexeril 10 mg every eight hours as needed which is a muscle relaxer  Some patients experience stomach irritation or in increased heartburn with anti-inflammatory drugs.  Please keep in mind that muscle relaxer's can cause fatigue and should not be taken while at work or driving.  Back pain is very common.  The pain often gets better over time.  The cause of back pain is usually not dangerous.  Most people can learn to manage their back pain on their own.  Home Care Stay active.  Start with short walks on flat ground if you can.  Try to walk farther each day. Do not sit, drive or stand in one place for more than 30 minutes.  Do not stay in bed. Do not avoid exercise or work.  Activity can help your back heal faster. Be careful when you bend or lift an object.  Bend at your knees, keep the object close to you, and do not twist. Sleep on a firm mattress.  Lie on your side, and bend your knees.  If you lie on your back, put a pillow under your knees. Only take medicines as told by your doctor. Put ice on the injured area. Put ice in a plastic bag Place a towel between your skin and the bag Leave the ice on for 15-20 minutes, 3-4 times a day for the first 2-3 days. 210 After that, you can switch between ice and heat packs. Ask your doctor about back exercises or massage. Avoid feeling anxious or stressed.  Find good ways to deal with stress, such as exercise.  Get Help Right Way If: Your pain does not go away with rest or medicine. Your pain does not go away in 1 week. You have new  problems. You do not feel well. The pain spreads into your legs. You cannot control when you poop (bowel movement) or pee (urinate) You feel sick to your stomach (nauseous) or throw up (vomit) You have belly (abdominal) pain. You feel like you may pass out (faint). If you develop a fever.  Make Sure you: Understand these instructions. Will watch your condition Will get help right away if you are not doing well or get worse.  Your e-visit answers were reviewed by a board certified advanced clinical practitioner to complete your personal care plan.  Depending on the condition, your plan could have included both over the counter or prescription medications.  If there is a problem please reply  once you have received a response from your provider.  Your safety is important to [place].  If you have drug allergies check your prescription carefully.    You can use MyChart to ask questions about today's visit, request a non-urgent call back, or ask for a work or school excuse for 24 hours related to this e-Visit. If it has been greater than 24 hours you will need to follow up with your provider, or enter a new e-Visit to address those concerns.  You will get an e-mail in the next two days asking about  your experience.  I hope that your e-visit has been valuable and will speed your recovery. Thank you for using e-visits.   Meds ordered this encounter  Medications   naproxen (NAPROSYN) 500 MG tablet    Sig: Take 1 tablet (500 mg total) by mouth 2 (two) times daily with a meal for 7 days.    Dispense:  14 tablet    Refill:  0   cyclobenzaprine (FLEXERIL) 10 MG tablet    Sig: Take 1 tablet (10 mg total) by mouth 3 (three) times daily as needed for muscle spasms.    Dispense:  30 tablet    Refill:  0     I spent approximately 5 minutes reviewing the patient's history, current symptoms and coordinating their care today.

## 2023-09-26 ENCOUNTER — Other Ambulatory Visit: Payer: Self-pay | Admitting: Family Medicine

## 2023-09-26 DIAGNOSIS — E785 Hyperlipidemia, unspecified: Secondary | ICD-10-CM

## 2023-09-26 DIAGNOSIS — I10 Essential (primary) hypertension: Secondary | ICD-10-CM

## 2023-09-29 ENCOUNTER — Telehealth: Payer: BC Managed Care – PPO | Admitting: Physician Assistant

## 2023-09-29 DIAGNOSIS — B029 Zoster without complications: Secondary | ICD-10-CM | POA: Diagnosis not present

## 2023-09-29 MED ORDER — GABAPENTIN 100 MG PO CAPS: 100.0000 mg | ORAL_CAPSULE | Freq: Three times a day (TID) | ORAL | 0 refills | Status: DC

## 2023-09-29 MED ORDER — VALACYCLOVIR HCL 1 G PO TABS: 1000.0000 mg | ORAL_TABLET | Freq: Two times a day (BID) | ORAL | 0 refills | Status: AC

## 2023-09-29 NOTE — Progress Notes (Signed)
Virtual Visit Consent   [name], you are scheduled for a virtual visit with a [facility] provider today. Just as with appointments in the office, your consent must be obtained to participate. Your consent will be active for this visit and any virtual visit you may have with one of our providers in the next 365 days. If you have a MyChart account, a copy of this consent can be sent to you electronically.  As this is a virtual visit, video technology does not allow for your provider to perform a traditional examination. This may limit your provider's ability to fully assess your condition. If your provider identifies any concerns that need to be evaluated in person or the need to arrange testing (such as labs, EKG, etc.), we will make arrangements to do so. Although advances in technology are sophisticated, we cannot ensure that it will always work on either your end or our end. If the connection with a video visit is poor, the visit may have to be switched to a telephone visit. With either a video or telephone visit, we are not always able to ensure that we have a secure connection.  By engaging in this virtual visit, you consent to the provision of healthcare and authorize for your insurance to be billed (if applicable) for the services provided during this visit. Depending on your insurance coverage, you may receive a charge related to this service.  I need to obtain your verbal consent now. Are you willing to proceed with your visit today? Samuel Richards has provided verbal consent on 09/29/2023 for a virtual visit (video or telephone). Samuel Loveless, PA-C  Date: 09/29/2023 12:33 PM  Virtual Visit via Video Note   I, Samuel Richards, connected with  Samuel Richards  ([id number], 1966-04-12) on 09/29/23 at 12:30 PM EST by a video-enabled telemedicine application and verified that I am speaking with the correct person using two identifiers.  Location: Patient: Virtual Visit  Location Patient: Home Provider: Virtual Visit Location Provider: Home Office   I discussed the limitations of evaluation and management by telemedicine and the availability of in person appointments. The patient expressed understanding and agreed to proceed.    History of Present Illness: Samuel Richards is a 57 y.o. who identifies as a male who was assigned male at birth, and is being seen today for rash on face.  HPI: Rash This is a new problem. The problem has been gradually worsening since onset. The affected locations include the face and lips (all on right). The rash is characterized by blistering, pain and redness (prickling). He was exposed to nothing. Pertinent negatives include no congestion, cough, facial edema, fatigue or sore throat. Past treatments include nothing. The treatment provided no relief.  Did have chicken pox. Has not had shingles. Has not had shingles vaccine.    Problems:  Patient Active Problem List   Diagnosis Date Noted   Adenomatous polyp of colon 10/08/2022   Family history of diabetes mellitus 09/08/2018   Rhinitis, allergic 07/09/2016   GERD (gastroesophageal reflux disease) 05/17/2013   Hyperlipidemia LDL goal <130 11/14/2009   ESSENTIAL HYPERTENSION, BENIGN 11/14/2009    Allergies: No Known Allergies Medications:  Current Outpatient Medications:    gabapentin (NEURONTIN) 100 MG capsule, Take 1 capsule (100 mg total) by mouth 3 (three) times daily., Disp: 30 capsule, Rfl: 0   valACYclovir (VALTREX) 1000 MG tablet, Take 1 tablet (1,000 mg total) by mouth 2 (two) times daily for 7 days.,  Disp: 14 tablet, Rfl: 0   amLODipine (NORVASC) 10 MG tablet, TAKE 1 TABLET BY MOUTH EVERY DAY, Disp: 90 tablet, Rfl: 0   Carboxymethylcellulose Sodium (REFRESH LIQUIGEL) 1 % GEL, 1 drop as needed., Disp: , Rfl:    cyclobenzaprine (FLEXERIL) 10 MG tablet, Take 1 tablet (10 mg total) by mouth 3 (three) times daily as needed for muscle spasms., Disp: 30 tablet, Rfl: 0    losartan (COZAAR) 50 MG tablet, Take 1 tablet (50 mg total) by mouth daily. (Patient not taking: Reported on 10/08/2022), Disp: 90 tablet, Rfl: 3   Multiple Vitamins-Minerals (MULTIVITAMIN ADULT EXTRA C PO), , Disp: , Rfl:    rosuvastatin (CRESTOR) 10 MG tablet, TAKE 1 TABLET BY MOUTH EVERY DAY, Disp: 90 tablet, Rfl: 0   sildenafil (REVATIO) 20 MG tablet, Take 1 tablet (20 mg total) by mouth as needed., Disp: 30 tablet, Rfl: 5  Observations/Objective: Patient is well-developed, well-nourished in no acute distress.  Resting comfortably at home.  Head is normocephalic, atraumatic.  No labored breathing.  Speech is clear and coherent with logical content.  Patient is alert and oriented at baseline.  3 vesicular lesions in a linear path on right cheek with prickly pain sensation radiating from posterior scalp to right upper lip   Assessment and Plan: 1. Herpes zoster without complication (Primary) - valACYclovir (VALTREX) 1000 MG tablet; Take 1 tablet (1,000 mg total) by mouth 2 (two) times daily for 7 days.  Dispense: 14 tablet; Refill: 0 - gabapentin (NEURONTIN) 100 MG capsule; Take 1 capsule (100 mg total) by mouth 3 (three) times daily.  Dispense: 30 capsule; Refill: 0  - Suspected shingles on right cheek. Not affecting eye or ear. - Will prescribe Valtrex and Gabapentin - Cool compresses - Luke warm to cool showers - Seek in person evaluation if rash continues to spread or if any appear to become infected   Follow Up Instructions: I discussed the assessment and treatment plan with the patient. The patient was provided an opportunity to ask questions and all were answered. The patient agreed with the plan and demonstrated an understanding of the instructions.  A copy of instructions were sent to the patient via MyChart unless otherwise noted below.    The patient was advised to call back or seek an in-person evaluation if the symptoms worsen or if the condition fails to improve as  anticipated.    Samuel Loveless, PA-C

## 2023-09-29 NOTE — Patient Instructions (Signed)
Samuel Richards, thank you for joining Margaretann Loveless, PA-C for today's virtual visit.  While this provider is not your primary care provider (PCP), if your PCP is located in our provider database this encounter information will be shared with them immediately following your visit.   A [facility] MyChart account gives you access to today's visit and all your visits, tests, and labs performed at [facility] " click here if you don't have a [facility] MyChart account or go to [email or web address]  Consent: (Patient) Samuel Richards provided verbal consent for this virtual visit at the beginning of the encounter.  Current Medications:  Current Outpatient Medications:    gabapentin (NEURONTIN) 100 MG capsule, Take 1 capsule (100 mg total) by mouth 3 (three) times daily., Disp: 30 capsule, Rfl: 0   valACYclovir (VALTREX) 1000 MG tablet, Take 1 tablet (1,000 mg total) by mouth 2 (two) times daily for 7 days., Disp: 14 tablet, Rfl: 0   amLODipine (NORVASC) 10 MG tablet, TAKE 1 TABLET BY MOUTH EVERY DAY, Disp: 90 tablet, Rfl: 0   Carboxymethylcellulose Sodium (REFRESH LIQUIGEL) 1 % GEL, 1 drop as needed., Disp: , Rfl:    cyclobenzaprine (FLEXERIL) 10 MG tablet, Take 1 tablet (10 mg total) by mouth 3 (three) times daily as needed for muscle spasms., Disp: 30 tablet, Rfl: 0   losartan (COZAAR) 50 MG tablet, Take 1 tablet (50 mg total) by mouth daily. (Patient not taking: Reported on 10/08/2022), Disp: 90 tablet, Rfl: 3   Multiple Vitamins-Minerals (MULTIVITAMIN ADULT EXTRA C PO), , Disp: , Rfl:    rosuvastatin (CRESTOR) 10 MG tablet, TAKE 1 TABLET BY MOUTH EVERY DAY, Disp: 90 tablet, Rfl: 0   sildenafil (REVATIO) 20 MG tablet, Take 1 tablet (20 mg total) by mouth as needed., Disp: 30 tablet, Rfl: 5   Medications ordered in this encounter:  Meds ordered this encounter  Medications   valACYclovir (VALTREX) 1000 MG tablet    Sig: Take 1 tablet (1,000 mg total) by mouth 2  (two) times daily for 7 days.    Dispense:  14 tablet    Refill:  0    Supervising Provider:   Merrilee Jansky [[id number]]   gabapentin (NEURONTIN) 100 MG capsule    Sig: Take 1 capsule (100 mg total) by mouth 3 (three) times daily.    Dispense:  30 capsule    Refill:  0    Supervising Provider:   Merrilee Jansky [[id number]]     *If you need refills on other medications prior to your next appointment, please contact your pharmacy*  Follow-Up: Call back or seek an in-person evaluation if the symptoms worsen or if the condition fails to improve as anticipated.  [facility] [phone]  Other Instructions  Shingles  Shingles, or herpes zoster, is an infection. It gives you a skin rash and blisters. These infected areas may hurt a lot. Shingles only happens if: You've had chickenpox. You've been given a shot called a vaccine to protect you from getting chickenpox. Shingles is rare in this case. What are the causes? Shingles is caused by a germ called the varicella-zoster virus. This is the same germ that causes chickenpox. After you're exposed to the germ, it stays in your body but is dormant. This means it isn't active. Shingles happens if the germ becomes active again. This can happen years after you're first exposed to the germ. What increases the risk? You may be more likely to get shingles  if: You're older than 57 years of age. You're under a lot of stress. You have a weak immune system. The immune system is your body's defense system. It may be weak if: You have human immunodeficiency virus (HIV). You have acquired immunodeficiency syndrome (AIDS). You have cancer. You take medicines that weaken your immune system. These include organ transplant medicines. What are the signs or symptoms? The first symptoms of shingles may be itching, tingling, or pain. Your skin may feel like it's burning. A few days or weeks later, you'll get a rash. Here's what you can  expect: The rash is likely to be on one side of your body. The rash may be shaped like a belt or a band. Over time, it will turn into blisters filled with fluid. The blisters will break open and change into scabs. The scabs will dry up in about 2-3 weeks. You may also have: A fever. Chills. A headache. Nausea. How is this diagnosed? Shingles is diagnosed with a skin exam. A sample called a culture may be taken from one of your blisters and sent to a lab. This will show if you have shingles. How is this treated? The rash may last for several weeks. There's no cure for shingles, but your health care provider may give you medicines. These medicines may: Help with pain. Help with itching. Help with irritation and swelling. Help you get better sooner. Help to prevent long-term problems. If the rash is on your face, you may need to see an eye doctor or an ear, nose, and throat (ENT) doctor. Follow these instructions at home: Medicines Take your medicines only as told by your provider. Put an anti-itch cream or numbing cream on the rash or blisters as told by your provider. Relieving itching and discomfort  To help with itching: Put cold, wet cloths called cold compresses on the rash or blisters. Take a cool bath. Try adding baking soda or dry oatmeal to the water. Do not bathe in hot water. Use calamine lotion on the rash or blisters. You can get this type of lotion at the store. Blister and rash care Keep your rash covered with a loose bandage. Wear loose clothes that don't rub on your rash. Take care of your rash as told by your provider. Make sure you: Wash your hands with soap and water for at least 20 seconds before and after you change your bandage. If you can't use soap and water, use hand sanitizer. Keep your rash and blisters clean by washing them with mild soap and cool water. Change your bandage. Check your rash every day for signs of infection. Check for: More redness,  swelling, or pain. Fluid or blood. Warmth. Pus or a bad smell. Do not scratch your rash. Do not pick at your blisters. To help you not scratch: Keep your fingernails clean and cut short. Try to wear gloves or mittens when you sleep. General instructions Rest. Wash your hands often with soap and water for at least 20 seconds. If you can't use soap and water, use hand sanitizer. Washing your hands lowers your chance of getting a skin infection. Your infection can cause chickenpox in others. If you have blisters that aren't scabs yet, stay away from: Babies. Pregnant people. Children who have eczema. Older people who have organ transplants. People who have a long-term, or chronic, illness. Anyone who hasn't had chickenpox before. Anyone who hasn't gotten the chickenpox vaccine. How is this prevented? Vaccines are the best way to prevent  you from getting chickenpox or shingles. Talk with your provider about getting these shots. Where to find more information Centers for Disease Control and Prevention (CDC): TonerPromos.no Contact a health care provider if: Your pain doesn't get better with medicine. Your pain doesn't get better after the rash heals. You have any signs of infection around the rash. Your rash or blisters get worse. You have a fever or chills. Get help right away if: The rash is on your face or nose. You have pain in your face or by your eye. You lose feeling on one side of your face. You have trouble seeing. You have ear pain or ringing in your ear. This information is not intended to replace advice given to you by your health care provider. Make sure you discuss any questions you have with your health care provider. Document Revised: 11/15/2022 Document Reviewed: 11/15/2022 Elsevier Patient Education  2024 Elsevier Inc.    If you have been instructed to have an in-person evaluation today at a local Urgent Care facility, please use the link below. It will take you to a  list of all of our available [facility], including address, phone number and hours of operation. Please do not delay care.  [facility]  If you or a family member do not have a primary care provider, use the link below to schedule a visit and establish care. When you choose a [facility] primary care physician or advanced practice provider, you gain a long-term partner in health. Find a Primary Care Provider  Learn more about [place]'s in-office and virtual care options: [facility] - Get Care Now

## 2023-09-30 NOTE — Progress Notes (Signed)
Called pt reached vm, lmtrc.  Reviewed 12/16 schedules were full.

## 2023-10-29 ENCOUNTER — Ambulatory Visit: Payer: BC Managed Care – PPO | Admitting: Family Medicine

## 2023-10-29 VITALS — BP 130/88 | HR 76 | Ht 69.5 in | Wt 216.6 lb

## 2023-10-29 DIAGNOSIS — E785 Hyperlipidemia, unspecified: Secondary | ICD-10-CM

## 2023-10-29 DIAGNOSIS — K219 Gastro-esophageal reflux disease without esophagitis: Secondary | ICD-10-CM

## 2023-10-29 DIAGNOSIS — N5201 Erectile dysfunction due to arterial insufficiency: Secondary | ICD-10-CM

## 2023-10-29 DIAGNOSIS — I10 Essential (primary) hypertension: Secondary | ICD-10-CM

## 2023-10-29 DIAGNOSIS — Z833 Family history of diabetes mellitus: Secondary | ICD-10-CM | POA: Diagnosis not present

## 2023-10-29 DIAGNOSIS — D126 Benign neoplasm of colon, unspecified: Secondary | ICD-10-CM

## 2023-10-29 DIAGNOSIS — Z8619 Personal history of other infectious and parasitic diseases: Secondary | ICD-10-CM

## 2023-10-29 DIAGNOSIS — Z23 Encounter for immunization: Secondary | ICD-10-CM

## 2023-10-29 DIAGNOSIS — Z0000 Encounter for general adult medical examination without abnormal findings: Secondary | ICD-10-CM | POA: Diagnosis not present

## 2023-10-29 DIAGNOSIS — J301 Allergic rhinitis due to pollen: Secondary | ICD-10-CM

## 2023-10-29 LAB — LIPID PANEL

## 2023-10-29 MED ORDER — AMLODIPINE BESYLATE 10 MG PO TABS: 10.0000 mg | ORAL_TABLET | Freq: Every day | ORAL | 0 refills | Status: DC

## 2023-10-29 MED ORDER — ROSUVASTATIN CALCIUM 10 MG PO TABS: 10.0000 mg | ORAL_TABLET | Freq: Every day | ORAL | 0 refills | Status: DC

## 2023-10-29 MED ORDER — SILDENAFIL CITRATE 20 MG PO TABS: 20.0000 mg | ORAL_TABLET | ORAL | 5 refills | Status: DC | PRN

## 2023-10-29 NOTE — Progress Notes (Signed)
 Complete physical exam  Patient: Samuel Richards   DOB: Jun 24, 1966   58 y.o. Male  MRN: [id number]  Subjective:    Chief Complaint  Patient presents with   Annual Exam    Fasting.     Samuel Richards is a 58 y.o. male who presents today for a complete physical exam. He reports consuming a general diet. Home exercise routine includes an at-home gym with stationary bike, treadmill, and moderate to heavy weightlifting. He generally feels fairly well. He reports sleeping fairly well. He does not have additional problems to discuss today.  He recently did have shingles but now is essentially back to normal.  He continues on amlodipine .  Losartan  was stopped due to side effects.  Continues on Crestor  without difficulty.  He also would like a refill on his sildenafil .  He will be scheduled for repeat colonoscopy later this spring.  He does have reflux disease but is having very little difficulty with that. Family and social history as well as health maintenance and immunizations was reviewed.  Most recent fall risk assessment:    10/29/2023    9:12 AM  Fall Risk   Falls in the past year? 0  Number falls in past yr: 0  Injury with Fall? 0     Most recent depression screenings:    10/29/2023    9:12 AM 10/08/2022    9:26 AM  PHQ 2/9 Scores  PHQ - 2 Score 0 0    Vision:Within last year and Dental: No current dental problems and Last dental visit: January 2025    Patient Care Team: Watson Hacking, MD as PCP - General (Family Medicine)   Outpatient Medications Prior to Visit  Medication Sig   Multiple Vitamins-Minerals (MULTIVITAMIN ADULT EXTRA C PO)    Carboxymethylcellulose Sodium (REFRESH LIQUIGEL) 1 % GEL 1 drop as needed. (Patient not taking: Reported on 10/29/2023)   cyclobenzaprine  (FLEXERIL ) 10 MG tablet Take 1 tablet (10 mg total) by mouth 3 (three) times daily as needed for muscle spasms.   gabapentin  (NEURONTIN ) 100 MG capsule Take 1 capsule (100 mg total) by mouth 3  (three) times daily.   [DISCONTINUED] amLODipine  (NORVASC ) 10 MG tablet TAKE 1 TABLET BY MOUTH EVERY DAY   [DISCONTINUED] losartan  (COZAAR ) 50 MG tablet Take 1 tablet (50 mg total) by mouth daily. (Patient not taking: Reported on 10/08/2022)   [DISCONTINUED] rosuvastatin  (CRESTOR ) 10 MG tablet TAKE 1 TABLET BY MOUTH EVERY DAY   [DISCONTINUED] sildenafil  (REVATIO ) 20 MG tablet Take 1 tablet (20 mg total) by mouth as needed.   No facility-administered medications prior to visit.    Review of Systems  All other systems reviewed and are negative.         Objective:     BP 130/88   Pulse 76   Ht 5' 9.5" (1.765 m)   Wt 216 lb 9.6 oz (98.2 kg)   SpO2 96%   BMI 31.53 kg/m    Physical Exam  Alert and in no distress. Tympanic membranes and canals are normal. Pharyngeal area is normal. Neck is supple without adenopathy or thyromegaly. Cardiac exam shows a regular sinus rhythm without murmurs or gallops. Lungs are clear to auscultation.    Assessment & Plan:    Routine general medical examination at a health care facility - Plan: CBC with Differential/Platelet, Comprehensive metabolic panel, Lipid panel  Adenomatous polyp of colon, unspecified part of colon  ESSENTIAL HYPERTENSION, BENIGN - Plan: amLODipine  (NORVASC ) 10 MG tablet  Family history of diabetes mellitus  Gastroesophageal reflux disease without esophagitis  Hyperlipidemia LDL goal <130 - Plan: rosuvastatin  (CRESTOR ) 10 MG tablet  Seasonal allergic rhinitis due to pollen  Erectile dysfunction due to arterial insufficiency - Plan: sildenafil  (REVATIO ) 20 MG tablet  History of shingles  Immunization History  Administered Date(s) Administered   DT (Pediatric) 10/15/2003   Influenza Split 07/12/2013   Influenza, Seasonal, Injecte, Preservative Fre 07/23/2023   Influenza,inj,Quad PF,6+ Mos 07/09/2016, 10/08/2022   Influenza-Unspecified 08/04/2017, 08/15/2018, 07/03/2019, 08/28/2020, 08/07/2021   PFIZER(Purple  Top)SARS-COV-2 Vaccination 12/27/2019, 01/18/2020, 09/16/2020   Pfizer(Comirnaty)Fall Seasonal Vaccine 12 years and older 10/08/2022, 07/23/2023   Tdap 03/09/2009, 09/14/2019    Health Maintenance  Topic Date Due   HIV Screening  Never done   Colonoscopy  12/25/2023   Zoster Vaccines- Shingrix (1 of 2) 01/22/2026 (Originally 01/23/2016)   DTaP/Tdap/Td (4 - Td or Tdap) 09/13/2029   INFLUENZA VACCINE  Completed   Hepatitis C Screening  Completed   HPV VACCINES  Aged Out   COVID-19 Vaccine  Discontinued    Discussed health benefits of physical activity, and encouraged him to engage in regular exercise appropriate for his age and condition.  Problem List Items Addressed This Visit     Adenomatous polyp of colon   ESSENTIAL HYPERTENSION, BENIGN   Relevant Medications   sildenafil  (REVATIO ) 20 MG tablet   amLODipine  (NORVASC ) 10 MG tablet   rosuvastatin  (CRESTOR ) 10 MG tablet   Family history of diabetes mellitus   GERD (gastroesophageal reflux disease)   Hyperlipidemia LDL goal <130   Relevant Medications   sildenafil  (REVATIO ) 20 MG tablet   amLODipine  (NORVASC ) 10 MG tablet   rosuvastatin  (CRESTOR ) 10 MG tablet   Rhinitis, allergic   Other Visit Diagnoses       Routine general medical examination at a health care facility    -  Primary   Relevant Orders   CBC with Differential/Platelet   Comprehensive metabolic panel   Lipid panel     Erectile dysfunction due to arterial insufficiency       Relevant Medications   sildenafil  (REVATIO ) 20 MG tablet   amLODipine  (NORVASC ) 10 MG tablet   rosuvastatin  (CRESTOR ) 10 MG tablet     History of shingles           Recheck 1 year  Ron Cobbs, MD

## 2023-10-30 ENCOUNTER — Encounter: Payer: Self-pay | Admitting: Family Medicine

## 2023-10-30 ENCOUNTER — Telehealth: Payer: Self-pay

## 2023-10-30 ENCOUNTER — Other Ambulatory Visit (HOSPITAL_COMMUNITY): Payer: Self-pay

## 2023-10-30 LAB — CBC WITH DIFFERENTIAL/PLATELET
Basophils Absolute: 0 10*3/uL (ref 0.0–0.2)
Basos: 0 %
EOS (ABSOLUTE): 0 10*3/uL (ref 0.0–0.4)
Eos: 1 %
Hematocrit: 40 % (ref 37.5–51.0)
Hemoglobin: 13.6 g/dL (ref 13.0–17.7)
Immature Grans (Abs): 0 10*3/uL (ref 0.0–0.1)
Immature Granulocytes: 0 %
Lymphocytes Absolute: 1.9 10*3/uL (ref 0.7–3.1)
Lymphs: 44 %
MCH: 31.2 pg (ref 26.6–33.0)
MCHC: 34 g/dL (ref 31.5–35.7)
MCV: 92 fL (ref 79–97)
Monocytes Absolute: 0.3 10*3/uL (ref 0.1–0.9)
Monocytes: 7 %
Neutrophils Absolute: 2 10*3/uL (ref 1.4–7.0)
Neutrophils: 48 %
Platelets: 242 10*3/uL (ref 150–450)
RBC: 4.36 x10E6/uL (ref 4.14–5.80)
RDW: 13.4 % (ref 11.6–15.4)
WBC: 4.2 10*3/uL (ref 3.4–10.8)

## 2023-10-30 LAB — COMPREHENSIVE METABOLIC PANEL
AST: 19 [IU]/L (ref 0–40)
Bilirubin Total: 1.4 mg/dL — ABNORMAL HIGH (ref 0.0–1.2)
Chloride: 102 mmol/L (ref 96–106)
Creatinine, Ser: 1.25 mg/dL (ref 0.76–1.27)
Globulin, Total: 2.3 g/dL (ref 1.5–4.5)
Total Protein: 7.1 g/dL (ref 6.0–8.5)

## 2023-10-30 LAB — LIPID PANEL
Chol/HDL Ratio: 2.9 ratio (ref 0.0–5.0)
Cholesterol, Total: 118 mg/dL (ref 100–199)
HDL: 41 mg/dL (ref >39)
LDL Chol Calc (NIH): 57 mg/dL (ref 0–99)
Triglycerides: 106 mg/dL (ref 0–149)
VLDL Cholesterol Cal: 20 mg/dL (ref 5–40)

## 2023-10-30 LAB — COMPREHENSIVE METABOLIC PANEL WITH GFR
ALT: 29 IU/L (ref 0–44)
Albumin: 4.8 g/dL (ref 3.8–4.9)
Alkaline Phosphatase: 76 IU/L (ref 44–121)
BUN/Creatinine Ratio: 10 (ref 9–20)
BUN: 13 mg/dL (ref 6–24)
CO2: 24 mmol/L (ref 20–29)
Calcium: 9.6 mg/dL (ref 8.7–10.2)
Glucose: 86 mg/dL (ref 70–99)
Potassium: 4.7 mmol/L (ref 3.5–5.2)
Sodium: 142 mmol/L (ref 134–144)
eGFR: 67 mL/min/{1.73_m2} (ref >59)

## 2023-10-30 NOTE — Telephone Encounter (Signed)
Pharmacy Patient Advocate Encounter   Received notification from CoverMyMeds that prior authorization for Sildenafil  is required/requested.   Insurance verification completed.   The patient is insured through [facility] .   Per test claim: PA required; PA submitted to above mentioned insurance via CoverMyMeds Key/confirmation #/EOC (Key: [id number])  Status is pending

## 2023-10-31 NOTE — Telephone Encounter (Signed)
Pharmacy Patient Advocate Encounter  Received notification from [employer] that Prior Authorization for [facility] has been DENIED.  Full denial letter will be uploaded to the media tab. See denial reason below.   Pts Insurance requires that your prescribing doctor for this drug is a Audiological scientist    PA #/Case ID/Reference #: Key: [id number]    However, I have contacted the patients pref'd pharmacy and the drug has been filled under a ''Discount Card'' for a cost of 27.73 for a 30day supply and is ready for pickup

## 2023-11-19 DIAGNOSIS — Z1211 Encounter for screening for malignant neoplasm of colon: Secondary | ICD-10-CM | POA: Diagnosis not present

## 2023-12-09 ENCOUNTER — Encounter: Payer: Self-pay | Admitting: Internal Medicine

## 2023-12-16 ENCOUNTER — Encounter: Payer: Self-pay | Admitting: Family Medicine

## 2023-12-16 DIAGNOSIS — K5730 Diverticulosis of large intestine without perforation or abscess without bleeding: Secondary | ICD-10-CM | POA: Diagnosis not present

## 2023-12-16 DIAGNOSIS — K635 Polyp of colon: Secondary | ICD-10-CM | POA: Diagnosis not present

## 2023-12-16 DIAGNOSIS — Z860101 Personal history of adenomatous and serrated colon polyps: Secondary | ICD-10-CM | POA: Diagnosis not present

## 2023-12-16 DIAGNOSIS — Z1211 Encounter for screening for malignant neoplasm of colon: Secondary | ICD-10-CM | POA: Diagnosis not present

## 2023-12-16 LAB — HM COLONOSCOPY

## 2024-02-29 ENCOUNTER — Other Ambulatory Visit: Payer: Self-pay | Admitting: Family Medicine

## 2024-02-29 DIAGNOSIS — N5201 Erectile dysfunction due to arterial insufficiency: Secondary | ICD-10-CM

## 2024-03-21 ENCOUNTER — Other Ambulatory Visit: Payer: Self-pay | Admitting: Family Medicine

## 2024-03-21 DIAGNOSIS — E785 Hyperlipidemia, unspecified: Secondary | ICD-10-CM

## 2024-03-21 DIAGNOSIS — I10 Essential (primary) hypertension: Secondary | ICD-10-CM

## 2024-03-24 ENCOUNTER — Telehealth: Admitting: Physician Assistant

## 2024-03-24 DIAGNOSIS — J069 Acute upper respiratory infection, unspecified: Secondary | ICD-10-CM | POA: Diagnosis not present

## 2024-03-24 MED ORDER — BENZONATATE 100 MG PO CAPS: 100.0000 mg | ORAL_CAPSULE | Freq: Three times a day (TID) | ORAL | 0 refills | Status: AC | PRN

## 2024-03-24 NOTE — Progress Notes (Signed)

## 2024-03-24 NOTE — Progress Notes (Signed)
 I have spent 5 minutes in review of e-visit questionnaire, review and updating patient chart, medical decision making and response to patient.   Piedad Climes, PA-C

## 2024-03-25 ENCOUNTER — Telehealth: Admitting: Physician Assistant

## 2024-03-25 DIAGNOSIS — U071 COVID-19: Secondary | ICD-10-CM | POA: Diagnosis not present

## 2024-03-25 MED ORDER — NIRMATRELVIR/RITONAVIR (PAXLOVID)TABLET: 3.0000 | ORAL_TABLET | Freq: Two times a day (BID) | ORAL | 0 refills | Status: AC

## 2024-03-25 NOTE — Progress Notes (Signed)
 Virtual Visit Consent   Samuel Richards, you are scheduled for a virtual visit with a [facility] provider today. Just as with appointments in the office, your consent must be obtained to participate. Your consent will be active for this visit and any virtual visit you may have with one of our providers in the next 365 days. If you have a MyChart account, a copy of this consent can be sent to you electronically.  As this is a virtual visit, video technology does not allow for your provider to perform a traditional examination. This may limit your provider's ability to fully assess your condition. If your provider identifies any concerns that need to be evaluated in person or the need to arrange testing (such as labs, EKG, etc.), we will make arrangements to do so. Although advances in technology are sophisticated, we cannot ensure that it will always work on either your end or our end. If the connection with a video visit is poor, the visit may have to be switched to a telephone visit. With either a video or telephone visit, we are not always able to ensure that we have a secure connection.  By engaging in this virtual visit, you consent to the provision of healthcare and authorize for your insurance to be billed (if applicable) for the services provided during this visit. Depending on your insurance coverage, you may receive a charge related to this service.  I need to obtain your verbal consent now. Are you willing to proceed with your visit today? NUCHEM GRATTAN has provided verbal consent on 03/25/2024 for a virtual visit (video or telephone). Hyla Maillard, [place]  Date: 03/25/2024 4:21 PM   Virtual Visit via Video Note   I, Hyla Maillard, connected with  HERCHEL HOPKIN  ([id number], October 16, 1965) on 03/25/24 at  4:15 PM EDT by a video-enabled telemedicine application and verified that I am speaking with the correct person using two identifiers.  Location: Patient: Virtual Visit  Location Patient: Home Provider: Virtual Visit Location Provider: Home Office   I discussed the limitations of evaluation and management by telemedicine and the availability of in person appointments. The patient expressed understanding and agreed to proceed.    History of Present Illness: Samuel Richards is a 58 y.o. who identifies as a male who was assigned male at birth, and is being seen today for COVID-19. Was evaluated via EV yesterday for 2 days of URI symptoms including congestion, cough, fatigue. Overnight with aches and loose stool. Took home COVID test this AM and positive. Was instructed to submit video so antivirals could be discussed. .  Denies chest pain. SOB.   HPI: HPI  Problems:  Patient Active Problem List   Diagnosis Date Noted   Adenomatous polyp of colon 10/08/2022   Family history of diabetes mellitus 09/08/2018   Rhinitis, allergic 07/09/2016   GERD (gastroesophageal reflux disease) 05/17/2013   Hyperlipidemia LDL goal <130 11/14/2009   ESSENTIAL HYPERTENSION, BENIGN 11/14/2009    Allergies: No Known Allergies Medications:  Current Outpatient Medications:    nirmatrelvir/ritonavir (PAXLOVID) 20 x 150 MG & 10 x 100MG  TABS, Take 3 tablets by mouth 2 (two) times daily for 5 days. (Take nirmatrelvir 150 mg two tablets twice daily for 5 days and ritonavir 100 mg one tablet twice daily for 5 days) Patient GFR is > 60, Disp: 30 tablet, Rfl: 0   amLODipine  (NORVASC ) 10 MG tablet, TAKE 1 TABLET BY MOUTH EVERY DAY, Disp: 90 tablet, Rfl: 0  benzonatate  (TESSALON ) 100 MG capsule, Take 1 capsule (100 mg total) by mouth 3 (three) times daily as needed for cough., Disp: 30 capsule, Rfl: 0   Carboxymethylcellulose Sodium (REFRESH LIQUIGEL) 1 % GEL, 1 drop as needed. (Patient not taking: Reported on 10/29/2023), Disp: , Rfl:    Multiple Vitamins-Minerals (MULTIVITAMIN ADULT EXTRA C PO), , Disp: , Rfl:    rosuvastatin  (CRESTOR ) 10 MG tablet, TAKE 1 TABLET BY MOUTH EVERY DAY,  Disp: 90 tablet, Rfl: 0   sildenafil  (REVATIO ) 20 MG tablet, TAKE 1 TABLET BY MOUTH AS NEEDED (PA DENIED), Disp: 90 tablet, Rfl: 1  Observations/Objective: Patient is well-developed, well-nourished in no acute distress.  Resting comfortably at home.  Head is normocephalic, atraumatic.  No labored breathing. Speech is clear and coherent with logical content.  Patient is alert and oriented at baseline.   Assessment and Plan: 1. COVID-19 (Primary) - nirmatrelvir/ritonavir (PAXLOVID) 20 x 150 MG & 10 x 100MG  TABS; Take 3 tablets by mouth 2 (two) times daily for 5 days. (Take nirmatrelvir 150 mg two tablets twice daily for 5 days and ritonavir 100 mg one tablet twice daily for 5 days) Patient GFR is > 60  Dispense: 30 tablet; Refill: 0  Patient with multiple risk factors for complicated course of illness. Discussed risks/benefits of antiviral medications including most common potential ADRs. Patient voiced understanding and would like to proceed with antiviral medication. They are candidate for Paxlovid but will have him hold off on taking his Viagra  and Crestor  until antiviral is complete. Rx sent to pharmacy. Supportive measures, OTC medications and vitamin regimen reviewed. Quarantine reviewed in detail. Strict ER precautions discussed with patient.    Follow Up Instructions: I discussed the assessment and treatment plan with the patient. The patient was provided an opportunity to ask questions and all were answered. The patient agreed with the plan and demonstrated an understanding of the instructions.  A copy of instructions were sent to the patient via MyChart unless otherwise noted below.   The patient was advised to call back or seek an in-person evaluation if the symptoms worsen or if the condition fails to improve as anticipated.    Hyla Maillard, PA-C

## 2024-03-25 NOTE — Patient Instructions (Addendum)
 Samuel Richards, thank you for joining Hyla Maillard, PA-C for today's virtual visit.  While this provider is not your primary care provider (PCP), if your PCP is located in our provider database this encounter information will be shared with them immediately following your visit.   A [facility] MyChart account gives you access to today's visit and all your visits, tests, and labs performed at [facility]  click here if you don't have a [facility] MyChart account or go to [email or web address]  Consent: (Patient) Samuel Richards provided verbal consent for this virtual visit at the beginning of the encounter.  Current Medications:  Current Outpatient Medications:    nirmatrelvir/ritonavir (PAXLOVID) 20 x 150 MG & 10 x 100MG  TABS, Take 3 tablets by mouth 2 (two) times daily for 5 days. (Take nirmatrelvir 150 mg two tablets twice daily for 5 days and ritonavir 100 mg one tablet twice daily for 5 days) Patient GFR is > 60, Disp: 30 tablet, Rfl: 0   amLODipine  (NORVASC ) 10 MG tablet, TAKE 1 TABLET BY MOUTH EVERY DAY, Disp: 90 tablet, Rfl: 0   benzonatate  (TESSALON ) 100 MG capsule, Take 1 capsule (100 mg total) by mouth 3 (three) times daily as needed for cough., Disp: 30 capsule, Rfl: 0   Carboxymethylcellulose Sodium (REFRESH LIQUIGEL) 1 % GEL, 1 drop as needed. (Patient not taking: Reported on 10/29/2023), Disp: , Rfl:    Multiple Vitamins-Minerals (MULTIVITAMIN ADULT EXTRA C PO), , Disp: , Rfl:    rosuvastatin  (CRESTOR ) 10 MG tablet, TAKE 1 TABLET BY MOUTH EVERY DAY, Disp: 90 tablet, Rfl: 0   sildenafil  (REVATIO ) 20 MG tablet, TAKE 1 TABLET BY MOUTH AS NEEDED (PA DENIED), Disp: 90 tablet, Rfl: 1   Medications ordered in this encounter:  Meds ordered this encounter  Medications   nirmatrelvir/ritonavir (PAXLOVID) 20 x 150 MG & 10 x 100MG  TABS    Sig: Take 3 tablets by mouth 2 (two) times daily for 5 days. (Take nirmatrelvir 150 mg two tablets twice daily for 5 days  and ritonavir 100 mg one tablet twice daily for 5 days) Patient GFR is > 60    Dispense:  30 tablet    Refill:  0    Supervising Provider:   Corine Dice [phone]     *If you need refills on other medications prior to your next appointment, please contact your pharmacy*  Follow-Up: Call back or seek an in-person evaluation if the symptoms worsen or if the condition fails to improve as anticipated.  [facility] [phone]  Care Instructions: Please keep well-hydrated and get plenty of rest. Start a saline nasal rinse to flush out your nasal passages. You can use plain Mucinex to help thin congestion. Remember to hold off on taking the Viagra  or Crestor  while taking your Paxlovid. If you have a humidifier, running in the bedroom at night. I want you to start OTC vitamin D3 1000 units daily, vitamin C 1000 mg daily, and a zinc supplement. Please take prescribed medications as directed.      Isolation Instructions: You are to isolate at home until you have been fever free for at least 24 hours without a fever-reducing medication, and symptoms have been steadily improving for 24 hours. At that time,  you can end isolation but need to mask for an additional 5 days.   If you must be around other household members who do not have symptoms, you need to make sure that both you and the  family members are masking consistently with a high-quality mask.  If you note any worsening of symptoms despite treatment, please seek an in-person evaluation ASAP. If you note any significant shortness of breath or any chest pain, please seek ER evaluation. Please do not delay care!   COVID-19: What to Do if You Are Sick If you test positive and are an older adult or someone who is at high risk of getting very sick from COVID-19, treatment may be available. Contact a healthcare provider right away after a positive test to determine if you are eligible, even if your symptoms are mild  right now. You can also visit a Test to Treat location and, if eligible, receive a prescription from a provider. Don't delay: Treatment must be started within the first few days to be effective. If you have a fever, cough, or other symptoms, you might have COVID-19. Most people have mild illness and are able to recover at home. If you are sick: Keep track of your symptoms. If you have an emergency warning sign (including trouble breathing), call 911. Steps to help prevent the spread of COVID-19 if you are sick If you are sick with COVID-19 or think you might have COVID-19, follow the steps below to care for yourself and to help protect other people in your home and community. Stay home except to get medical care Stay home. Most people with COVID-19 have mild illness and can recover at home without medical care. Do not leave your home, except to get medical care. Do not visit public areas and do not go to places where you are unable to wear a mask. Take care of yourself. Get rest and stay hydrated. Take over-the-counter medicines, such as acetaminophen, to help you feel better. Stay in touch with your doctor. Call before you get medical care. Be sure to get care if you have trouble breathing, or have any other emergency warning signs, or if you think it is an emergency. Avoid public transportation, ride-sharing, or taxis if possible. Get tested If you have symptoms of COVID-19, get tested. While waiting for test results, stay away from others, including staying apart from those living in your household. Get tested as soon as possible after your symptoms start. Treatments may be available for people with COVID-19 who are at risk for becoming very sick. Don't delay: Treatment must be started early to be effective--some treatments must begin within 5 days of your first symptoms. Contact your healthcare provider right away if your test result is positive to determine if you are eligible. Self-tests are one  of several options for testing for the virus that causes COVID-19 and may be more convenient than laboratory-based tests and point-of-care tests. Ask your healthcare provider or your local health department if you need help interpreting your test results. You can visit your state, tribal, local, and territorial health department's website to look for the latest local information on testing sites. Separate yourself from other people As much as possible, stay in a specific room and away from other people and pets in your home. If possible, you should use a separate bathroom. If you need to be around other people or animals in or outside of the home, wear a well-fitting mask. Tell your close contacts that they may have been exposed to COVID-19. An infected person can spread COVID-19 starting 48 hours (or 2 days) before the person has any symptoms or tests positive. By letting your close contacts know they may have been exposed to  COVID-19, you are helping to protect everyone. See COVID-19 and Animals if you have questions about pets. If you are diagnosed with COVID-19, someone from the health department may call you. Answer the call to slow the spread. Monitor your symptoms Symptoms of COVID-19 include fever, cough, or other symptoms. Follow care instructions from your healthcare provider and local health department. Your local health authorities may give instructions on checking your symptoms and reporting information. When to seek emergency medical attention Look for emergency warning signs* for COVID-19. If someone is showing any of these signs, seek emergency medical care immediately: Trouble breathing Persistent pain or pressure in the chest New confusion Inability to wake or stay awake Pale, gray, or blue-colored skin, lips, or nail beds, depending on skin tone *This list is not all possible symptoms. Please call your medical provider for any other symptoms that are severe or concerning to  you. Call 911 or call ahead to your local emergency facility: Notify the operator that you are seeking care for someone who has or may have COVID-19. Call ahead before visiting your doctor Call ahead. Many medical visits for routine care are being postponed or done by phone or telemedicine. If you have a medical appointment that cannot be postponed, call your doctor's office, and tell them you have or may have COVID-19. This will help the office protect themselves and other patients. If you are sick, wear a well-fitting mask You should wear a mask if you must be around other people or animals, including pets (even at home). Wear a mask with the best fit, protection, and comfort for you. You don't need to wear the mask if you are alone. If you can't put on a mask (because of trouble breathing, for example), cover your coughs and sneezes in some other way. Try to stay at least 6 feet away from other people. This will help protect the people around you. Masks should not be placed on young children under age 38 years, anyone who has trouble breathing, or anyone who is not able to remove the mask without help. Cover your coughs and sneezes Cover your mouth and nose with a tissue when you cough or sneeze. Throw away used tissues in a lined trash can. Immediately wash your hands with soap and water for at least 20 seconds. If soap and water are not available, clean your hands with an alcohol-based hand sanitizer that contains at least 60% alcohol. Clean your hands often Wash your hands often with soap and water for at least 20 seconds. This is especially important after blowing your nose, coughing, or sneezing; going to the bathroom; and before eating or preparing food. Use hand sanitizer if soap and water are not available. Use an alcohol-based hand sanitizer with at least 60% alcohol, covering all surfaces of your hands and rubbing them together until they feel dry. Soap and water are the best option,  especially if hands are visibly dirty. Avoid touching your eyes, nose, and mouth with unwashed hands. Handwashing Tips Avoid sharing personal household items Do not share dishes, drinking glasses, cups, eating utensils, towels, or bedding with other people in your home. Wash these items thoroughly after using them with soap and water or put in the dishwasher. Clean surfaces in your home regularly Clean and disinfect high-touch surfaces (for example, doorknobs, tables, handles, light switches, and countertops) in your sick room and bathroom. In shared spaces, you should clean and disinfect surfaces and items after each use by the person who is  ill. If you are sick and cannot clean, a caregiver or other person should only clean and disinfect the area around you (such as your bedroom and bathroom) on an as needed basis. Your caregiver/other person should wait as long as possible (at least several hours) and wear a mask before entering, cleaning, and disinfecting shared spaces that you use. Clean and disinfect areas that may have blood, stool, or body fluids on them. Use household cleaners and disinfectants. Clean visible dirty surfaces with household cleaners containing soap or detergent. Then, use a household disinfectant. Use a product from [employer] List N: Disinfectants for Coronavirus (COVID-19). Be sure to follow the instructions on the label to ensure safe and effective use of the product. Many products recommend keeping the surface wet with a disinfectant for a certain period of time (look at contact time on the product label). You may also need to wear personal protective equipment, such as gloves, depending on the directions on the product label. Immediately after disinfecting, wash your hands with soap and water for 20 seconds. For completed guidance on cleaning and disinfecting your home, visit Complete Disinfection Guidance. Take steps to improve ventilation at home Improve ventilation  (air flow) at home to help prevent from spreading COVID-19 to other people in your household. Clear out COVID-19 virus particles in the air by opening windows, using air filters, and turning on fans in your home. Use this interactive tool to learn how to improve air flow in your home. When you can be around others after being sick with COVID-19 Deciding when you can be around others is different for different situations. Find out when you can safely end home isolation. For any additional questions about your care, contact your healthcare provider or state or local health department. 01/02/2021 Content source: [facility] for Immunization and Respiratory Diseases ([facility]), Division of Viral Diseases This information is not intended to replace advice given to you by your health care provider. Make sure you discuss any questions you have with your health care provider. Document Revised: 02/15/2021 Document Reviewed: 02/15/2021 Elsevier Patient Education  2022 [employer].     If you have been instructed to have an in-person evaluation today at a local Urgent Care facility, please use the link below. It will take you to a list of all of our available [facility], including address, phone number and hours of operation. Please do not delay care.  [facility]  If you or a family member do not have a primary care provider, use the link below to schedule a visit and establish care. When you choose a [facility] primary care physician or advanced practice provider, you gain a long-term partner in health. Find a Primary Care Provider  Learn more about [facility]'s in-office and virtual care options: [facility] - Get Care Now

## 2024-04-30 ENCOUNTER — Encounter: Payer: Self-pay | Admitting: Advanced Practice Midwife

## 2024-06-18 ENCOUNTER — Other Ambulatory Visit: Payer: Self-pay | Admitting: Family Medicine

## 2024-06-18 DIAGNOSIS — I10 Essential (primary) hypertension: Secondary | ICD-10-CM

## 2024-06-18 DIAGNOSIS — E785 Hyperlipidemia, unspecified: Secondary | ICD-10-CM

## 2024-07-09 ENCOUNTER — Telehealth: Admitting: Family Medicine

## 2024-07-09 DIAGNOSIS — H6993 Unspecified Eustachian tube disorder, bilateral: Secondary | ICD-10-CM | POA: Diagnosis not present

## 2024-07-09 MED ORDER — PREDNISONE 20 MG PO TABS: 20.0000 mg | ORAL_TABLET | Freq: Two times a day (BID) | ORAL | 0 refills | Status: AC

## 2024-07-09 NOTE — Patient Instructions (Signed)
 Eustachian Tube Dysfunction  Eustachian tube dysfunction refers to a condition in which a blockage develops in the narrow passage that connects the middle ear to the back of the nose (eustachian tube). The eustachian tube regulates air pressure in the middle ear by letting air move between the ear and nose. It also helps to drain fluid from the middle ear space. Eustachian tube dysfunction can affect one or both ears. When the eustachian tube does not function properly, air pressure, fluid, or both can build up in the middle ear. What are the causes? This condition occurs when the eustachian tube becomes blocked or cannot open normally. Common causes of this condition include: Ear infections. Colds and other infections that affect the nose, mouth, and throat (upper respiratory tract). Allergies. Irritation from cigarette smoke. Irritation from stomach acid coming up into the esophagus (gastroesophageal reflux). The esophagus is the part of the body that moves food from the mouth to the stomach. Sudden changes in air pressure, such as from descending in an airplane or scuba diving. Abnormal growths in the nose or throat, such as: Growths that line the nose (nasal polyps). Abnormal growth of cells (tumors). Enlarged tissue at the back of the throat (adenoids). What increases the risk? You are more likely to develop this condition if: You smoke. You are overweight. You are a child who has: Certain birth defects of the mouth, such as cleft palate. Large tonsils or adenoids. What are the signs or symptoms? Common symptoms of this condition include: A feeling of fullness in the ear. Ear pain. Clicking or popping noises in the ear. Ringing in the ear (tinnitus). Hearing loss. Loss of balance. Dizziness. Symptoms may get worse when the air pressure around you changes, such as when you travel to an area of high elevation, fly on an airplane, or go scuba diving. How is this diagnosed? This  condition may be diagnosed based on: Your symptoms. A physical exam of your ears, nose, and throat. Tests, such as those that measure: The movement of your eardrum. Your hearing (audiometry). How is this treated? Treatment depends on the cause and severity of your condition. In mild cases, you may relieve your symptoms by moving air into your ears. This is called "popping the ears." In more severe cases, or if you have symptoms of fluid in your ears, treatment may include: Medicines to relieve congestion (decongestants). Medicines that treat allergies (antihistamines). Nasal sprays or ear drops that contain medicines that reduce swelling (steroids). A procedure to drain the fluid in your eardrum. In this procedure, a small tube may be placed in the eardrum to: Drain the fluid. Restore the air in the middle ear space. A procedure to insert a balloon device through the nose to inflate the opening of the eustachian tube (balloon dilation). Follow these instructions at home: Lifestyle Do not do any of the following until your health care provider approves: Travel to high altitudes. Fly in airplanes. Work in a Estate agent or room. Scuba dive. Do not use any products that contain nicotine or tobacco. These products include cigarettes, chewing tobacco, and vaping devices, such as e-cigarettes. If you need help quitting, ask your health care provider. Keep your ears dry. Wear fitted earplugs during showering and bathing. Dry your ears completely after. General instructions Take over-the-counter and prescription medicines only as told by your health care provider. Use techniques to help pop your ears as recommended by your health care provider. These may include: Chewing gum. Yawning. Frequent, forceful swallowing.  Closing your mouth, holding your nose closed, and gently blowing as if you are trying to blow air out of your nose. Keep all follow-up visits. This is important. Contact a  health care provider if: Your symptoms do not go away after treatment. Your symptoms come back after treatment. You are unable to pop your ears. You have: A fever. Pain in your ear. Pain in your head or neck. Fluid draining from your ear. Your hearing suddenly changes. You become very dizzy. You lose your balance. Get help right away if: You have a sudden, severe increase in any of your symptoms. Summary Eustachian tube dysfunction refers to a condition in which a blockage develops in the eustachian tube. It can be caused by ear infections, allergies, inhaled irritants, or abnormal growths in the nose or throat. Symptoms may include ear pain or fullness, hearing loss, or ringing in the ears. Mild cases are treated with techniques to unblock the ears, such as yawning or chewing gum. More severe cases are treated with medicines or procedures. This information is not intended to replace advice given to you by your health care provider. Make sure you discuss any questions you have with your health care provider. Document Revised: 12/11/2020 Document Reviewed: 12/11/2020 Elsevier Patient Education  2024 [employer].

## 2024-07-09 NOTE — Progress Notes (Signed)
 Virtual Visit Consent   [name], you are scheduled for a virtual visit with a [facility] provider today. Just as with appointments in the office, your consent must be obtained to participate. Your consent will be active for this visit and any virtual visit you may have with one of our providers in the next 365 days. If you have a MyChart account, a copy of this consent can be sent to you electronically.  As this is a virtual visit, video technology does not allow for your provider to perform a traditional examination. This may limit your provider's ability to fully assess your condition. If your provider identifies any concerns that need to be evaluated in person or the need to arrange testing (such as labs, EKG, etc.), we will make arrangements to do so. Although advances in technology are sophisticated, we cannot ensure that it will always work on either your end or our end. If the connection with a video visit is poor, the visit may have to be switched to a telephone visit. With either a video or telephone visit, we are not always able to ensure that we have a secure connection.  By engaging in this virtual visit, you consent to the provision of healthcare and authorize for your insurance to be billed (if applicable) for the services provided during this visit. Depending on your insurance coverage, you may receive a charge related to this service.  I need to obtain your verbal consent now. Are you willing to proceed with your visit today? TAJI BARRETTO has provided verbal consent on 07/09/2024 for a virtual visit (video or telephone). Loa Lamp, FNP  Date: 07/09/2024 10:28 AM   Virtual Visit via Video Note   I, Loa Lamp, connected with  Samuel Richards  ([id number], Jun 09, 1966) on 07/09/24 at 10:30 AM EDT by a video-enabled telemedicine application and verified that I am speaking with the correct person using two identifiers.  Location: Patient: Virtual Visit Location  Patient: Home Provider: Virtual Visit Location Provider: Home Office   I discussed the limitations of evaluation and management by telemedicine and the availability of in person appointments. The patient expressed understanding and agreed to proceed.    History of Present Illness: Samuel Richards is a 58 y.o. who identifies as a male who was assigned male at birth, and is being seen today for feeling like his head is in a cloud. Feels like there is fluid in ears. No other URI sx. Gait normal. No fever. No dizziness, blurred vision or CP.   HPI: HPI  Problems:  Patient Active Problem List   Diagnosis Date Noted   Adenomatous polyp of colon 10/08/2022   Family history of diabetes mellitus 09/08/2018   Rhinitis, allergic 07/09/2016   GERD (gastroesophageal reflux disease) 05/17/2013   Hyperlipidemia LDL goal <130 11/14/2009   ESSENTIAL HYPERTENSION, BENIGN 11/14/2009    Allergies: No Known Allergies Medications:  Current Outpatient Medications:    amLODipine  (NORVASC ) 10 MG tablet, TAKE 1 TABLET BY MOUTH EVERY DAY, Disp: 90 tablet, Rfl: 0   benzonatate  (TESSALON ) 100 MG capsule, Take 1 capsule (100 mg total) by mouth 3 (three) times daily as needed for cough., Disp: 30 capsule, Rfl: 0   Carboxymethylcellulose Sodium (REFRESH LIQUIGEL) 1 % GEL, 1 drop as needed. (Patient not taking: Reported on 10/29/2023), Disp: , Rfl:    Multiple Vitamins-Minerals (MULTIVITAMIN ADULT EXTRA C PO), , Disp: , Rfl:    rosuvastatin  (CRESTOR ) 10 MG tablet, TAKE 1 TABLET BY MOUTH  EVERY DAY, Disp: 90 tablet, Rfl: 0   sildenafil  (REVATIO ) 20 MG tablet, TAKE 1 TABLET BY MOUTH AS NEEDED (PA DENIED), Disp: 90 tablet, Rfl: 1  Observations/Objective: Patient is well-developed, well-nourished in no acute distress.  Resting comfortably  at home.  Head is normocephalic, atraumatic.  No labored breathing.  Speech is clear and coherent with logical content.  Patient is alert and oriented at baseline.    Assessment  and Plan: 1. Dysfunction of both eustachian tubes (Primary)  Start allegra and flonase. Prednisone . UC if sx persist or worsen.   Follow Up Instructions: I discussed the assessment and treatment plan with the patient. The patient was provided an opportunity to ask questions and all were answered. The patient agreed with the plan and demonstrated an understanding of the instructions.  A copy of instructions were sent to the patient via MyChart unless otherwise noted below.     The patient was advised to call back or seek an in-person evaluation if the symptoms worsen or if the condition fails to improve as anticipated.    Jedidiah Demartini, FNP

## 2024-07-19 ENCOUNTER — Ambulatory Visit: Admitting: Family Medicine

## 2024-07-19 ENCOUNTER — Encounter: Payer: Self-pay | Admitting: Family Medicine

## 2024-07-19 ENCOUNTER — Ambulatory Visit: Payer: Self-pay

## 2024-07-19 VITALS — BP 122/80 | HR 76 | Wt 219.0 lb

## 2024-07-19 DIAGNOSIS — R42 Dizziness and giddiness: Secondary | ICD-10-CM | POA: Diagnosis not present

## 2024-07-19 MED ORDER — MECLIZINE HCL 12.5 MG PO TABS: 12.5000 mg | ORAL_TABLET | Freq: Three times a day (TID) | ORAL | 0 refills | Status: AC | PRN

## 2024-07-19 NOTE — Progress Notes (Signed)
   Name: Samuel Richards   Date of Visit: 07/19/24   Date of last visit with me: Visit date not found   CHIEF COMPLAINT:  Chief Complaint  Patient presents with   Acute Visit    Off balance, fluid in ears. 2 Sundays ago, stood up almost like he blacked out without falling, everything has been off ever sense.        HPI:  Discussed the use of AI scribe software for clinical note transcription with the patient, who gave verbal consent to proceed.  History of Present Illness   Samuel Richards is a 58 year old male who presents with dizziness and sinus drainage issues.  He has been experiencing dizziness and a sensation of being off balance for approximately two to three weeks, which began while standing in church. The dizziness is described as a feeling of movement and imbalance without any spinning sensation. It is sometimes associated with head movements, such as twisting his head side to side, and can occur even when he is still. He also notes vision changes during these episodes.  Despite using Flonase and steroids prescribed a few days ago, the dizziness persists. However, the sinus drainage and congestion have decreased with the use of Flonase and Allegra, although he did not initially experience significant congestion. No sensation of the room spinning and no feeling like he is going to pass out, although he feels jittery, especially when driving, due to nervousness about the dizziness.         OBJECTIVE:       10/29/2023    9:12 AM  Depression screen PHQ 2/9  Decreased Interest 0  Down, Depressed, Hopeless 0  PHQ - 2 Score 0     BP Readings from Last 3 Encounters:  07/19/24 122/80  10/29/23 130/88  10/08/22 (!) 140/94    BP 122/80   Pulse 76   Wt 219 lb (99.3 kg)   SpO2 98%   BMI 31.88 kg/m    Physical Exam   HEENT: Possible fluid behind the ear, not overtly present.      Physical Exam Constitutional:      Appearance: Normal appearance.  HENT:      Right Ear: Hearing, ear canal and external ear normal. A middle ear effusion is present. Tympanic membrane is not erythematous or retracted.     Left Ear: Hearing, ear canal and external ear normal. A middle ear effusion is present. Tympanic membrane is not erythematous or retracted.  Neurological:     Mental Status: He is alert.     ASSESSMENT/PLAN:   Assessment & Plan Vertigo    Assessment and Plan    Dizziness and imbalance with suspected Eustachian tube dysfunction and benign paroxysmal positional vertigo Intermittent dizziness and imbalance likely due to BPPV or Eustachian tube dysfunction. Fluid behind the ear present but not significant. Sudden onset suggests less concerning etiology. - Prescribed meclizine up to three times daily as needed, starting with a lower dose. - Referred to ENT for further evaluation and possible tilt table test. - Sent prescription to [facility] on [address].         Deshanna Kama A. Vita MD [facility]

## 2024-07-19 NOTE — Telephone Encounter (Signed)
 FYI Only or Action Required?: FYI only for provider.  Patient was last seen in primary care on 07/09/2024 by Blair, Diane W, FNP.  Called Nurse Triage reporting balance.  Symptoms began 07/09/24.  Interventions attempted: Nothing.  Symptoms are: unchanged.  Triage Disposition: See PCP When Office is Open (Within 3 Days)  Patient/caregiver understands and will follow disposition?: yes       Copied from CRM #[id number]. Topic: Clinical - Red Word Triage >> Jul 19, 2024 10:50 AM Charlet HERO wrote: Red Word that prompted transfer to Nurse Triage: Patient is calling bc he was prescribed prednison for his ear and it is not working he is stating that it feels like his equal librium is off. Lalonde  pfm Reason for Disposition  [1] Ear congestion lasts > 3 days AND [2] no improvement after using Care Advice  (Exception: Ear congestion is a chronic symptom.)  Answer Assessment - Initial Assessment Questions 1. LOCATION: Which ear is involved?       Both  2. SENSATION: Describe how the ear feels. (e.g., stuffy, full, plugged).      Fluid  3. ONSET:  When did the ear symptoms start?       *No Answer* 4. PAIN: Do you also have an earache? If Yes, ask: How bad is it? (Scale 0-10; none, mild, moderate or severe)     no 5. CAUSE: What do you think is causing the ear congestion? (e.g., common cold, nasal allergies, recent flight, recent snorkeling)     unsire 6. OTHER SYMPTOMS: Do you have any other symptoms? (e.g., ear drainage, hay fever symptoms such as sneezing or a clear nasal discharge; cold symptoms such as a cough or runny nose)     Prednisone  no change in sx  7. PREGNANCY: Is there any chance you are pregnant? When was your last menstrual period?     N/a  Protocols used: Ear - Congestion-A-AH

## 2024-08-09 ENCOUNTER — Ambulatory Visit (INDEPENDENT_AMBULATORY_CARE_PROVIDER_SITE_OTHER)

## 2024-08-09 ENCOUNTER — Encounter (INDEPENDENT_AMBULATORY_CARE_PROVIDER_SITE_OTHER): Payer: Self-pay

## 2024-08-09 ENCOUNTER — Ambulatory Visit

## 2024-08-09 ENCOUNTER — Institutional Professional Consult (permissible substitution) (INDEPENDENT_AMBULATORY_CARE_PROVIDER_SITE_OTHER): Admitting: Physician Assistant

## 2024-08-09 VITALS — BP 147/84 | HR 74 | Ht 69.5 in | Wt 210.0 lb

## 2024-08-09 DIAGNOSIS — R2689 Other abnormalities of gait and mobility: Secondary | ICD-10-CM

## 2024-08-09 DIAGNOSIS — H8120 Vestibular neuronitis, unspecified ear: Secondary | ICD-10-CM

## 2024-08-09 DIAGNOSIS — H938X3 Other specified disorders of ear, bilateral: Secondary | ICD-10-CM

## 2024-08-09 DIAGNOSIS — Z23 Encounter for immunization: Secondary | ICD-10-CM

## 2024-08-09 DIAGNOSIS — R42 Dizziness and giddiness: Secondary | ICD-10-CM | POA: Diagnosis not present

## 2024-08-09 NOTE — Progress Notes (Signed)
 Dear Dr. Vita, Here is my assessment for our mutual patient, Samuel Richards. Thank you for allowing me the opportunity to care for your patient. Please do not hesitate to contact me should you have any other questions. Sincerely, Dr. Hadassah Parody  Otolaryngology Clinic Note Referring provider: Dr. Vita HPI:   Initial HPI (08/09/2024) Discussed the use of AI scribe software for clinical note transcription with the patient, who gave verbal consent to proceed.  History of Present Illness Samuel Richards is a 58 year old male who presents with dizziness and imbalance.  Dizziness and imbalance - Dizziness and sensation of imbalance for three to four weeks - Initial episode occurred while standing at church, with near-syncope but no loss of consciousness - ever since then he has had off- balance sensation, especially when standing or with rapid head movement - Dizziness is fleeting, lasting seconds - room does not spin and he has no associated hearing loss or tinnitus  Aural fullness - Sensation of fullness in the ears, which has improved - No chronic ear problems  Associated symptoms and exclusions - No recent illness - No cardiac symptoms    Independent Review of Additional Tests or Records:  Referral note Reyne Vita, MD (07/19/24): dizziness and sinus drainage. Dizziness described as being off balance no spinning sensation. Given meclizine.   CBC 10/29/23: Hgb 13.6 CMP 10/29/23: Cr 1.25    PMH/Meds/All/SocHx/FamHx/ROS:   Past Medical History:  Diagnosis Date   Allergy    Dyslipidemia    FH: CAD (coronary artery disease)    Hypertension      Past Surgical History:  Procedure Laterality Date   LAPAROSCOPIC CHOLECYSTECTOMY  2010   Dr. Nerissa    Family History  Problem Relation Age of Onset   Diabetes Mother    Hyperlipidemia Mother    Hypertension Mother    Heart disease Father    Hearing loss Father 34   Stroke Father    Diabetes Sister    Diabetes Brother     Heart disease Brother 61       MI     Social Connections: Socially Integrated (10/29/2023)   Social Connection and Isolation Panel    Frequency of Communication with Friends and Family: Twice a week    Frequency of Social Gatherings with Friends and Family: Twice a week    Attends Religious Services: More than 4 times per year    Active Member of [employer] or Organizations: Yes    Attends Engineer, Structural: More than 4 times per year    Marital Status: Married     Current Outpatient Medications  Medication Instructions   amLODipine  (NORVASC ) 10 mg, Oral, Daily   benzonatate  (TESSALON ) 100 mg, Oral, 3 times daily PRN   Carboxymethylcellulose Sodium (REFRESH LIQUIGEL) 1 % GEL 1 drop as needed.   meclizine (ANTIVERT) 12.5 mg, Oral, 3 times daily PRN   Multiple Vitamins-Minerals (MULTIVITAMIN ADULT EXTRA C PO) No dose, route, or frequency recorded.   rosuvastatin  (CRESTOR ) 10 mg, Oral, Daily   sildenafil  (REVATIO ) 20 MG tablet TAKE 1 TABLET BY MOUTH AS NEEDED (PA DENIED)     Physical Exam:   BP (!) 147/84   Pulse 74   Ht 5' 9.5 (1.765 m)   Wt 210 lb (95.3 kg)   SpO2 94%   BMI 30.57 kg/m   Salient findings:  CN II-XII intact Given history and complaints, ear microscopy was indicated and performed for evaluation with findings as below in physical exam section  and in procedures Bilateral EAC clear and TM intact with well pneumatized middle ear spaces No lesions of oral cavity/oropharynx No obviously palpable neck masses/lymphadenopathy/thyromegaly No respiratory distress or stridor  Seprately Identifiable Procedures:  Prior to initiating any procedures, risks/benefits/alternatives were explained to the patient and verbal consent obtained.  Procedure (08/09/2024): Bilateral ear microscopy using microscope (CPT [id number]) Pre-procedure diagnosis: ear fullness, dizziness Post-procedure diagnosis: same Indication: see above; given patient's otologic complaints and history,  for improved and comprehensive examination of external ear and tympanic membrane, bilateral otologic examination using microscope was performed. Prior to proceeding, verbal consent was obtained after discussion of R/B/A  Procedure: Patient was placed semi-recumbent. Both ear canals were examined using the microscope with findings above. Patient tolerated the procedure well.   Impression & Plans:  Samuel Richards is a 58 y.o. male with   1. Vestibular neuronitis, unspecified laterality   2. Imbalance   3. Dizziness   4. Ear fullness, bilateral     Assessment and Plan Assessment & Plan Dizziness and imbalance, possible vestibular neuritis - unclear if truly vertigo or not. Typically vertigo has sensation of room spinning which he does not have - Hx is not consistent with BPPV - possibly this is a post-viral vestibular neuritis  given that he had accompanying ear fullness (without hearing loss or tinnitus or vomiting, dizzy spells last few seconds)  - Symptoms improving, no vertigo or hearing loss. Resolved ear fluid. Recommend observation and he agrees.   Fainting episode - Recommend discussing with PCP if fainting episodes recur. Pt states he did not have breakfast that morning and think possible this contributed. Fainting is not a symptom of vertigo and should be further worked up if continues    See below regarding exact medications prescribed this encounter including dosages and route: No orders of the defined types were placed in this encounter.   Thank you for allowing me the opportunity to care for your patient. Please do not hesitate to contact me should you have any other questions.  Sincerely, Hadassah Parody, MD Otolaryngologist (ENT), [facility] Phone: [phone] Fax: [phone]

## 2024-09-18 ENCOUNTER — Other Ambulatory Visit: Payer: Self-pay | Admitting: Family Medicine

## 2024-09-18 DIAGNOSIS — E785 Hyperlipidemia, unspecified: Secondary | ICD-10-CM

## 2024-09-18 DIAGNOSIS — N5201 Erectile dysfunction due to arterial insufficiency: Secondary | ICD-10-CM

## 2024-09-18 DIAGNOSIS — I10 Essential (primary) hypertension: Secondary | ICD-10-CM

## 2024-10-15 ENCOUNTER — Telehealth: Admitting: Emergency Medicine

## 2024-10-15 DIAGNOSIS — J329 Chronic sinusitis, unspecified: Secondary | ICD-10-CM | POA: Diagnosis not present

## 2024-10-15 MED ORDER — AMOXICILLIN-POT CLAVULANATE 875-125 MG PO TABS: 1.0000 | ORAL_TABLET | Freq: Two times a day (BID) | ORAL | 0 refills | Status: AC

## 2024-10-15 NOTE — Progress Notes (Signed)
 " Virtual Visit Consent   Samuel Richards, you are scheduled for a virtual visit with a [facility] provider today. Just as with appointments in the office, your consent must be obtained to participate. Your consent will be active for this visit and any virtual visit you may have with one of our providers in the next 365 days. If you have a MyChart account, a copy of this consent can be sent to you electronically.  As this is a virtual visit, video technology does not allow for your provider to perform a traditional examination. This may limit your provider's ability to fully assess your condition. If your provider identifies any concerns that need to be evaluated in person or the need to arrange testing (such as labs, EKG, etc.), we will make arrangements to do so. Although advances in technology are sophisticated, we cannot ensure that it will always work on either your end or our end. If the connection with a video visit is poor, the visit may have to be switched to a telephone visit. With either a video or telephone visit, we are not always able to ensure that we have a secure connection.  By engaging in this virtual visit, you consent to the provision of healthcare and authorize for your insurance to be billed (if applicable) for the services provided during this visit. Depending on your insurance coverage, you may receive a charge related to this service.  I need to obtain your verbal consent now. Are you willing to proceed with your visit today? Samuel Richards has provided verbal consent on 10/15/2024 for a virtual visit (video or telephone). Lamar Schlossman, PA-C  Date: 10/15/2024 11:07 AM   Virtual Visit via Video Note   I, Lamar Schlossman, connected with  Samuel Richards  ([id number], September 24, 1966) on 10/15/2024 at 11:00 AM EST by a video-enabled telemedicine application and verified that I am speaking with the correct person using two identifiers.  Location: Patient: Virtual Visit Location  Patient: Home Provider: Virtual Visit Location Provider: Home Office   I discussed the limitations of evaluation and management by telemedicine and the availability of in person appointments. The patient expressed understanding and agreed to proceed.    History of Present Illness: Samuel Richards is a 59 y.o. who identifies as a male who was assigned male at birth, and is being seen today for sinus infection.  States that he has been having a lot of sinus pressure and discharge.  States that he has been having thick green phlegm.  States that he has been having symptoms a little over 4 days. Denies fever.    HPI: HPI  Problems:  Patient Active Problem List   Diagnosis Date Noted   Adenomatous polyp of colon 10/08/2022   Family history of diabetes mellitus 09/08/2018   Rhinitis, allergic 07/09/2016   GERD (gastroesophageal reflux disease) 05/17/2013   Hyperlipidemia LDL goal <130 11/14/2009   ESSENTIAL HYPERTENSION, BENIGN 11/14/2009    Allergies: Allergies[1] Medications: Current Medications[2]  Observations/Objective: Patient is well-developed, well-nourished in no acute distress.  Resting comfortably  at home.  Head is normocephalic, atraumatic.  No labored breathing.  Speech is clear and coherent with logical content.  Patient is alert and oriented at baseline.    Assessment and Plan: 1. Sinusitis, unspecified chronicity, unspecified location (Primary)   Meds ordered this encounter  Medications   amoxicillin -clavulanate (AUGMENTIN) 875-125 MG tablet    Sig: Take 1 tablet by mouth every 12 (twelve) hours.    Dispense:  14 tablet    Refill:  0    Supervising Provider:   BLAISE ALEENE KIDD [[id number]]     Follow Up Instructions: I discussed the assessment and treatment plan with the patient. The patient was provided an opportunity to ask questions and all were answered. The patient agreed with the plan and demonstrated an understanding of the instructions.  A copy of  instructions were sent to the patient via MyChart unless otherwise noted below.     The patient was advised to call back or seek an in-person evaluation if the symptoms worsen or if the condition fails to improve as anticipated.    Lamar Schlossman, PA-C     [1] No Known Allergies [2]  Current Outpatient Medications:    amoxicillin -clavulanate (AUGMENTIN) 875-125 MG tablet, Take 1 tablet by mouth every 12 (twelve) hours., Disp: 14 tablet, Rfl: 0   amLODipine  (NORVASC ) 10 MG tablet, TAKE 1 TABLET BY MOUTH EVERY DAY, Disp: 90 tablet, Rfl: 0   benzonatate  (TESSALON ) 100 MG capsule, Take 1 capsule (100 mg total) by mouth 3 (three) times daily as needed for cough., Disp: 30 capsule, Rfl: 0   Carboxymethylcellulose Sodium (REFRESH LIQUIGEL) 1 % GEL, 1 drop as needed. (Patient not taking: Reported on 07/19/2024), Disp: , Rfl:    meclizine  (ANTIVERT ) 12.5 MG tablet, Take 1 tablet (12.5 mg total) by mouth 3 (three) times daily as needed for dizziness., Disp: 30 tablet, Rfl: 0   Multiple Vitamins-Minerals (MULTIVITAMIN ADULT EXTRA C PO), , Disp: , Rfl:    rosuvastatin  (CRESTOR ) 10 MG tablet, TAKE 1 TABLET BY MOUTH EVERY DAY, Disp: 90 tablet, Rfl: 0   sildenafil  (REVATIO ) 20 MG tablet, TAKE 1 TABLET BY MOUTH AS NEEDED (PA DENIED), Disp: 90 tablet, Rfl: 1  "

## 2024-10-15 NOTE — Patient Instructions (Signed)
" °  Samuel Richards, thank you for joining Lamar Schlossman, PA-C for today's virtual visit.  While this provider is not your primary care provider (PCP), if your PCP is located in our provider database this encounter information will be shared with them immediately following your visit.   A [facility] MyChart account gives you access to today's visit and all your visits, tests, and labs performed at [facility]  click here if you don't have a [facility] MyChart account or go to [email or web address]  Consent: (Patient) Samuel Richards provided verbal consent for this virtual visit at the beginning of the encounter.  Current Medications:  Current Outpatient Medications:    amoxicillin -clavulanate (AUGMENTIN) 875-125 MG tablet, Take 1 tablet by mouth every 12 (twelve) hours., Disp: 14 tablet, Rfl: 0   amLODipine  (NORVASC ) 10 MG tablet, TAKE 1 TABLET BY MOUTH EVERY DAY, Disp: 90 tablet, Rfl: 0   benzonatate  (TESSALON ) 100 MG capsule, Take 1 capsule (100 mg total) by mouth 3 (three) times daily as needed for cough., Disp: 30 capsule, Rfl: 0   Carboxymethylcellulose Sodium (REFRESH LIQUIGEL) 1 % GEL, 1 drop as needed. (Patient not taking: Reported on 07/19/2024), Disp: , Rfl:    meclizine  (ANTIVERT ) 12.5 MG tablet, Take 1 tablet (12.5 mg total) by mouth 3 (three) times daily as needed for dizziness., Disp: 30 tablet, Rfl: 0   Multiple Vitamins-Minerals (MULTIVITAMIN ADULT EXTRA C PO), , Disp: , Rfl:    rosuvastatin  (CRESTOR ) 10 MG tablet, TAKE 1 TABLET BY MOUTH EVERY DAY, Disp: 90 tablet, Rfl: 0   sildenafil  (REVATIO ) 20 MG tablet, TAKE 1 TABLET BY MOUTH AS NEEDED (PA DENIED), Disp: 90 tablet, Rfl: 1   Medications ordered in this encounter:  Meds ordered this encounter  Medications   amoxicillin -clavulanate (AUGMENTIN) 875-125 MG tablet    Sig: Take 1 tablet by mouth every 12 (twelve) hours.    Dispense:  14 tablet    Refill:  0    Supervising Provider:   BLAISE ALEENE KIDD  [[id number]]     *If you need refills on other medications prior to your next appointment, please contact your pharmacy*  Follow-Up: Call back or seek an in-person evaluation if the symptoms worsen or if the condition fails to improve as anticipated.  [facility] [phone]  Other Instructions    If you have been instructed to have an in-person evaluation today at a local Urgent Care facility, please use the link below. It will take you to a list of all of our available [facility], including address, phone number and hours of operation. Please do not delay care.  [facility]  If you or a family member do not have a primary care provider, use the link below to schedule a visit and establish care. When you choose a [facility] primary care physician or advanced practice provider, you gain a long-term partner in health. Find a Primary Care Provider  Learn more about [facility]'s in-office and virtual care options:  - Get Care Now  "

## 2024-12-01 ENCOUNTER — Encounter: Payer: BC Managed Care – PPO | Admitting: Family Medicine

## 2024-12-06 ENCOUNTER — Encounter: Admitting: Family Medicine
